# Patient Record
Sex: Male | Born: 1995 | Race: Black or African American | Hispanic: No | Marital: Single | Smoking: Never smoker
Health system: Southern US, Community
[De-identification: ages and names within clinical notes are randomized; demographics above are authoritative.]

## PROBLEM LIST (undated history)

## (undated) ENCOUNTER — Emergency Department (HOSPITAL_COMMUNITY): Discharge status: Absent without leave | Payer: Self-pay

## (undated) DIAGNOSIS — Z8619 Personal history of other infectious and parasitic diseases: Secondary | ICD-10-CM

## (undated) DIAGNOSIS — S63095A Other dislocation of left wrist and hand, initial encounter: Secondary | ICD-10-CM

## (undated) DIAGNOSIS — T148XXA Other injury of unspecified body region, initial encounter: Secondary | ICD-10-CM

---
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
## 2013-12-22 ENCOUNTER — Emergency Department (INDEPENDENT_AMBULATORY_CARE_PROVIDER_SITE_OTHER)
Admission: EM | Admit: 2013-12-22 | Discharge: 2013-12-22 | Disposition: A | Discharge status: Home / Self Care | Payer: Medicaid Other | Source: Home / Self Care | Attending: Family Medicine | Admitting: Family Medicine

## 2013-12-22 ENCOUNTER — Encounter (HOSPITAL_COMMUNITY): Payer: Self-pay | Admitting: Emergency Medicine

## 2013-12-22 ENCOUNTER — Other Ambulatory Visit (HOSPITAL_COMMUNITY)
Admission: RE | Admit: 2013-12-22 | Discharge: 2013-12-22 | Disposition: A | Discharge status: Home / Self Care | Payer: Medicaid Other | Source: Ambulatory Visit | Attending: Family Medicine | Admitting: Family Medicine

## 2013-12-22 DIAGNOSIS — Z113 Encounter for screening for infections with a predominantly sexual mode of transmission: Secondary | ICD-10-CM

## 2013-12-22 DIAGNOSIS — Z202 Contact with and (suspected) exposure to infections with a predominantly sexual mode of transmission: Secondary | ICD-10-CM

## 2013-12-22 MED ORDER — LIDOCAINE HCL (PF) 1 % IJ SOLN
INTRAMUSCULAR | Status: AC
  Filled 2013-12-22: qty 5

## 2013-12-22 MED ORDER — CEFTRIAXONE SODIUM 250 MG IJ SOLR
INTRAMUSCULAR | Status: AC
  Filled 2013-12-22: qty 250

## 2013-12-22 MED ORDER — AZITHROMYCIN 250 MG PO TABS
1000.0000 mg | ORAL_TABLET | Freq: Once | ORAL | Status: AC
  Administered 2013-12-22: 1000 mg via ORAL

## 2013-12-22 MED ORDER — AZITHROMYCIN 250 MG PO TABS
ORAL_TABLET | ORAL | Status: AC
  Filled 2013-12-22: qty 4

## 2013-12-22 MED ORDER — CEFTRIAXONE SODIUM 1 G IJ SOLR
250.0000 mg | Freq: Once | INTRAMUSCULAR | Status: AC
Start: 2013-12-22 — End: 2013-12-22
  Administered 2013-12-22: 250 mg via INTRAMUSCULAR

## 2013-12-22 NOTE — ED Notes (Signed)
Patient knows there is a post injection delay prior to discharge

## 2013-12-22 NOTE — ED Provider Notes (Signed)
CSN: [id number]     Arrival date & time 12/22/13  1534 History   First MD Initiated Contact with Patient 12/22/13 1745     No chief complaint on file.  (Consider location/radiation/quality/duration/timing/severity/associated sxs/prior Treatment) Patient is a 18 y.o. male presenting with STD exposure. The history is provided by the patient.  Exposure to STD This is a new problem. The current episode started yesterday (by by girl yest that she testred pos for gc/chlamydia, . pt last encounter 2 weeks ago without condom.). The problem has not changed since onset.   Past Medical History  Diagnosis Date  . Herpes    No past surgical history on file. No family history on file. History  Substance Use Topics  . Smoking status: Never Smoker   . Smokeless tobacco: Not on file  . Alcohol Use: No    Review of Systems  Gastrointestinal: Negative.   Genitourinary: Negative.     Allergies  Review of patient's allergies indicates no known allergies.  Home Medications   Prior to Admission medications   Medication Sig Start Date End Date Taking? Authorizing Provider  metroNIDAZOLE (FLAGYL) 500 MG tablet Take 1 tablet (500 mg total) by mouth once. 05/19/12   Johnsie Kindredarmen L Chatten, NP   BP 118/72  Pulse 71  Temp(Src) 97.2 F (36.2 C) (Oral)  Resp 14  SpO2 94% Physical Exam  Nursing note and vitals reviewed. Constitutional: He is oriented to person, place, and time. He appears well-developed and well-nourished.  Abdominal: Soft. Bowel sounds are normal.  Genitourinary: Penis normal. No penile tenderness.  Neurological: He is alert and oriented to person, place, and time.  Skin: Skin is warm and dry.    ED Course  Procedures (including critical care time) Labs Review Labs Reviewed  URINE CYTOLOGY ANCILLARY ONLY    Imaging Review No results found.   MDM  No diagnosis found.     Linna HoffJames D Kendarius Vigen, MD 12/22/13 [id number]

## 2013-12-22 NOTE — ED Notes (Signed)
Patient reports he does not have any symptoms.  He was called and told he needed to be checked

## 2013-12-27 ENCOUNTER — Emergency Department (HOSPITAL_COMMUNITY)
Admission: EM | Admit: 2013-12-27 | Discharge: 2013-12-28 | Disposition: A | Discharge status: Home / Self Care | Payer: No Typology Code available for payment source | Attending: Emergency Medicine | Admitting: Emergency Medicine

## 2013-12-27 ENCOUNTER — Emergency Department (HOSPITAL_COMMUNITY): Payer: No Typology Code available for payment source

## 2013-12-27 ENCOUNTER — Telehealth (HOSPITAL_COMMUNITY): Payer: Self-pay | Admitting: *Deleted

## 2013-12-27 ENCOUNTER — Encounter (HOSPITAL_COMMUNITY): Payer: Self-pay | Admitting: Emergency Medicine

## 2013-12-27 DIAGNOSIS — S01112A Laceration without foreign body of left eyelid and periocular area, initial encounter: Secondary | ICD-10-CM

## 2013-12-27 DIAGNOSIS — Y92410 Unspecified street and highway as the place of occurrence of the external cause: Secondary | ICD-10-CM | POA: Diagnosis not present

## 2013-12-27 DIAGNOSIS — S0010XA Contusion of unspecified eyelid and periocular area, initial encounter: Secondary | ICD-10-CM | POA: Diagnosis present

## 2013-12-27 DIAGNOSIS — Z23 Encounter for immunization: Secondary | ICD-10-CM | POA: Diagnosis not present

## 2013-12-27 DIAGNOSIS — S51009A Unspecified open wound of unspecified elbow, initial encounter: Secondary | ICD-10-CM | POA: Insufficient documentation

## 2013-12-27 DIAGNOSIS — S51812A Laceration without foreign body of left forearm, initial encounter: Secondary | ICD-10-CM

## 2013-12-27 DIAGNOSIS — Y9389 Activity, other specified: Secondary | ICD-10-CM | POA: Diagnosis not present

## 2013-12-27 DIAGNOSIS — S058X9A Other injuries of unspecified eye and orbit, initial encounter: Secondary | ICD-10-CM | POA: Insufficient documentation

## 2013-12-27 DIAGNOSIS — Z8619 Personal history of other infectious and parasitic diseases: Secondary | ICD-10-CM | POA: Diagnosis not present

## 2013-12-27 MED ORDER — TETANUS-DIPHTH-ACELL PERTUSSIS 5-2.5-18.5 LF-MCG/0.5 IM SUSP
0.5000 mL | Freq: Once | INTRAMUSCULAR | Status: AC
  Administered 2013-12-27: 0.5 mL via INTRAMUSCULAR
  Filled 2013-12-27: qty 0.5

## 2013-12-27 MED ORDER — HYDROCODONE-ACETAMINOPHEN 5-325 MG PO TABS: 1.0000 | ORAL_TABLET | ORAL | Status: DC | PRN

## 2013-12-27 MED ORDER — NAPROXEN 500 MG PO TABS: 500.0000 mg | ORAL_TABLET | Freq: Two times a day (BID) | ORAL | Status: DC

## 2013-12-27 NOTE — ED Notes (Signed)
GC/Trich neg., Chlamydia pos. Pt. Adequately treated with Zithromax and also got Rocephin.  I called mobile number, but VM not set up. I called home number and it is not a valid number. I called contact and left a message for pt. to call. Call 1. Kurt Sullivan 12/27/2013
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
## 2013-12-27 NOTE — ED Notes (Signed)
In xray

## 2013-12-28 NOTE — ED Notes (Signed)
Pt discharged home with all belongings, pt alert, oriented and ambulatory upon discharge, 2 new rx prescribed, pt verbalizes understanding of discharge instructions, pt driven home by family

## 2013-12-28 NOTE — ED Notes (Signed)
I called and left a message to call with contact.  Call 2.  Pt. Called back.  Pt. verified x 2 and given results.  Pt. Told he was adequately treated with Zithromax.  Pt. instructed to notify his partner, no sex for 1 week and to practice safe sex. [facility] by [facility].  [facility]. Desiree LucySuzanne M [facility] 12/28/2013

## 2014-01-03 NOTE — ED Provider Notes (Signed)
Medical screening examination/treatment/procedure(s) were performed by non-physician practitioner and as supervising physician I was immediately available for consultation/collaboration.   EKG Interpretation None        Florentina Marquart, MD 01/03/14 1534 

## 2015-08-30 ENCOUNTER — Emergency Department (INDEPENDENT_AMBULATORY_CARE_PROVIDER_SITE_OTHER)
Admission: EM | Admit: 2015-08-30 | Discharge: 2015-08-30 | Disposition: A | Discharge status: Home / Self Care | Payer: Medicaid Other | Source: Home / Self Care | Attending: Family Medicine | Admitting: Family Medicine

## 2015-08-30 ENCOUNTER — Other Ambulatory Visit (HOSPITAL_COMMUNITY)
Admission: RE | Admit: 2015-08-30 | Discharge: 2015-08-30 | Disposition: A | Discharge status: Home / Self Care | Payer: Medicaid Other | Source: Ambulatory Visit | Attending: Family Medicine | Admitting: Family Medicine

## 2015-08-30 ENCOUNTER — Encounter (HOSPITAL_COMMUNITY): Payer: Self-pay | Admitting: Emergency Medicine

## 2015-08-30 DIAGNOSIS — Z113 Encounter for screening for infections with a predominantly sexual mode of transmission: Secondary | ICD-10-CM

## 2015-08-30 DIAGNOSIS — Z711 Person with feared health complaint in whom no diagnosis is made: Secondary | ICD-10-CM

## 2015-08-30 NOTE — ED Notes (Signed)
Patient reports having unprotected sex earlier this week and is concerned for std.  Denies any symptoms and does not know of any definite exposure

## 2015-08-30 NOTE — ED Provider Notes (Signed)
CSN: [id number]     Arrival date & time 08/30/15  1301 History   First MD Initiated Contact with Patient 08/30/15 1319     Chief Complaint  Patient presents with  . SEXUALLY TRANSMITTED DISEASE   (Consider location/radiation/quality/duration/timing/severity/associated sxs/prior Treatment) HPI Comments: 20 year old male states he had unprotected sex earlier in the week and now was to be tested for STDs. He states he is unaware of any known exposure. He is asymptomatic.   Past Medical History  Diagnosis Date  . Herpes    History reviewed. No pertinent past surgical history. No family history on file. Social History  Substance Use Topics  . Smoking status: Never Smoker   . Smokeless tobacco: None  . Alcohol Use: No    Review of Systems  Constitutional: Negative.   HENT: Negative.   Genitourinary: Negative.   Skin: Negative.   Neurological: Negative.   All other systems reviewed and are negative.   Allergies  Review of patient's allergies indicates no known allergies.  Home Medications   Prior to Admission medications   Medication Sig Start Date End Date Taking? Authorizing Provider  HYDROcodone-acetaminophen (NORCO) 5-325 MG per tablet Take 1 tablet by mouth every 4 (four) hours as needed. 12/27/13   Arthor Captain, PA-C  naproxen (NAPROSYN) 500 MG tablet Take 1 tablet (500 mg total) by mouth 2 (two) times daily with a meal. 12/27/13   Arthor Captain, PA-C   Meds Ordered and Administered this Visit  Medications - No data to display  BP 114/71 mmHg  Pulse 65  Temp(Src) 97.8 F (36.6 C) (Oral)  Resp 16  SpO2 98% No data found.   Physical Exam  Constitutional: He is oriented to person, place, and time. He appears well-developed and well-nourished. No distress.  Eyes: EOM are normal.  Neck: Normal range of motion. Neck supple.  Cardiovascular: Normal rate.   Pulmonary/Chest: Effort normal. No respiratory distress.  Musculoskeletal: He exhibits no edema.   Neurological: He is alert and oriented to person, place, and time. He exhibits normal muscle tone.  Skin: Skin is dry.  Psychiatric: He has a normal mood and affect.  Nursing note and vitals reviewed.   ED Course  Procedures (including critical care time)  Labs Review Labs Reviewed  URINE CYTOLOGY ANCILLARY ONLY    Imaging Review No results found.   Visual Acuity Review  Right Eye Distance:   Left Eye Distance:   Bilateral Distance:    Right Eye Near:   Left Eye Near:    Bilateral Near:         MDM   1. Concern about STD in male without diagnosis    Std and Safe sex info given Urine cytology for STD pending Will call and tx appropriately after results are back.    Hayden Rasmussen, NP 08/30/15 1348

## 2015-09-02 ENCOUNTER — Telehealth (HOSPITAL_COMMUNITY): Payer: Self-pay | Admitting: Emergency Medicine

## 2015-09-02 LAB — URINE CYTOLOGY ANCILLARY ONLY
Chlamydia: NEGATIVE
Neisseria Gonorrhea: NEGATIVE
TRICH (WINDOWPATH): NEGATIVE

## 2015-09-02 NOTE — ED Notes (Signed)
Called pt and notified her of recent lab results from visit 08/30/15 Pt ID'd properly... Reports she's feeling better Pt is Neg for Gon/Chlam/Trich Pt given education on safe sex and proper hgyiene Adv pt if sx are not getting better to return  Pt verb understanding.

## 2015-09-13 ENCOUNTER — Other Ambulatory Visit (HOSPITAL_COMMUNITY)
Admission: RE | Admit: 2015-09-13 | Discharge: 2015-09-13 | Disposition: A | Discharge status: Home / Self Care | Payer: Self-pay | Source: Ambulatory Visit | Attending: Family Medicine | Admitting: Family Medicine

## 2015-09-13 ENCOUNTER — Emergency Department (INDEPENDENT_AMBULATORY_CARE_PROVIDER_SITE_OTHER)
Admission: EM | Admit: 2015-09-13 | Discharge: 2015-09-13 | Disposition: A | Discharge status: Home / Self Care | Payer: Self-pay | Source: Home / Self Care | Attending: Family Medicine | Admitting: Family Medicine

## 2015-09-13 ENCOUNTER — Encounter (HOSPITAL_COMMUNITY): Payer: Self-pay | Admitting: *Deleted

## 2015-09-13 DIAGNOSIS — A6000 Herpesviral infection of urogenital system, unspecified: Secondary | ICD-10-CM

## 2015-09-13 DIAGNOSIS — Z113 Encounter for screening for infections with a predominantly sexual mode of transmission: Secondary | ICD-10-CM | POA: Insufficient documentation

## 2015-09-13 DIAGNOSIS — N341 Nonspecific urethritis: Secondary | ICD-10-CM

## 2015-09-13 MED ORDER — AZITHROMYCIN 250 MG PO TABS
1000.0000 mg | ORAL_TABLET | Freq: Once | ORAL | Status: AC
  Administered 2015-09-13: 1000 mg via ORAL

## 2015-09-13 MED ORDER — CEFTRIAXONE SODIUM 250 MG IJ SOLR
250.0000 mg | Freq: Once | INTRAMUSCULAR | Status: AC
  Administered 2015-09-13: 250 mg via INTRAMUSCULAR

## 2015-09-13 MED ORDER — AZITHROMYCIN 250 MG PO TABS
ORAL_TABLET | ORAL | Status: AC
  Filled 2015-09-13: qty 4

## 2015-09-13 MED ORDER — CEFTRIAXONE SODIUM 250 MG IJ SOLR
INTRAMUSCULAR | Status: AC
  Filled 2015-09-13: qty 250

## 2015-09-13 NOTE — ED Provider Notes (Signed)
[id number]     Arrival date & time 09/13/15  1300 History   First MD Initiated Contact with Patient 09/13/15 1311     No chief complaint on file.  (Consider location/radiation/quality/duration/timing/severity/associated sxs/prior Treatment) Patient is a 20 y.o. male presenting with STD exposure. The history is provided by the patient.  Exposure to STD This is a recurrent problem. The current episode started more than 2 days ago (seen 1/20 for std check, results neg, having unprotected sex and now with dysuria and herpes outbreak since not taking valtrex.). The problem has been gradually worsening. Pertinent negatives include no chest pain, no abdominal pain, no headaches and no shortness of breath.    Past Medical History  Diagnosis Date  . Herpes    History reviewed. No pertinent past surgical history. History reviewed. No pertinent family history. Social History  Substance Use Topics  . Smoking status: Never Smoker   . Smokeless tobacco: None  . Alcohol Use: No    Review of Systems  Constitutional: Negative.   Respiratory: Negative for shortness of breath.   Cardiovascular: Negative for chest pain.  Gastrointestinal: Negative for abdominal pain.  Genitourinary: Positive for dysuria and genital sores. Negative for discharge, penile swelling, scrotal swelling, penile pain and testicular pain.  Skin: Positive for rash.  Neurological: Negative for headaches.  All other systems reviewed and are negative.   Allergies  Review of patient's allergies indicates no known allergies.  Home Medications   Prior to Admission medications   Medication Sig Start Date End Date Taking? Authorizing Provider  HYDROcodone-acetaminophen (NORCO) 5-325 MG per tablet Take 1 tablet by mouth every 4 (four) hours as needed. 12/27/13   Arthor Captain, PA-C  naproxen (NAPROSYN) 500 MG tablet Take 1 tablet (500 mg total) by mouth 2 (two) times daily with a meal. 12/27/13   Arthor Captain, PA-C   Meds  Ordered and Administered this Visit   Medications  cefTRIAXone (ROCEPHIN) injection 250 mg (not administered)  azithromycin (ZITHROMAX) tablet 1,000 mg (not administered)    BP 111/69 mmHg  Pulse 66  Temp(Src) 97.9 F (36.6 C) (Oral)  Resp 16  SpO2 100% No data found.   Physical Exam  Constitutional: He is oriented to person, place, and time. He appears well-developed and well-nourished.  Abdominal: Soft. Bowel sounds are normal.  Genitourinary: Testes normal.    Circumcised. Penile tenderness present. No discharge found.  Lymphadenopathy:       Right: No inguinal adenopathy present.       Left: No inguinal adenopathy present.  Neurological: He is alert and oriented to person, place, and time.  Skin: Skin is warm and dry.  Nursing note and vitals reviewed.   ED Course  Procedures (including critical care time)  Labs Review Labs Reviewed  CYTOLOGY, (ORAL, ANAL, URETHRAL) ANCILLARY ONLY    Imaging Review No results found.   Visual Acuity Review  Right Eye Distance:   Left Eye Distance:   Bilateral Distance:    Right Eye Near:   Left Eye Near:    Bilateral Near:         MDM   1. Urethritis, nonspecific   2. Herpes genitalis    Meds ordered this encounter  Medications  . cefTRIAXone (ROCEPHIN) injection 250 mg    Sig:   . azithromycin (ZITHROMAX) tablet 1,000 mg    Sig:        Linna Hoff, MD 09/13/15 1343

## 2015-09-13 NOTE — ED Notes (Signed)
Pt  Reports  Symptoms  Of  Burning  On  Urination           Seen  ucc  sev  Weeks  Ago    Continues  To  Have  Symptoms

## 2015-09-13 NOTE — Discharge Instructions (Signed)
We will call with positive test results and treat as indicated  °

## 2015-09-17 LAB — CYTOLOGY, (ORAL, ANAL, URETHRAL) ANCILLARY ONLY
Chlamydia: NEGATIVE
Neisseria Gonorrhea: NEGATIVE

## 2015-12-07 ENCOUNTER — Encounter (HOSPITAL_COMMUNITY): Payer: Self-pay | Admitting: Emergency Medicine

## 2015-12-07 ENCOUNTER — Emergency Department (HOSPITAL_COMMUNITY)
Admission: EM | Admit: 2015-12-07 | Discharge: 2015-12-07 | Disposition: A | Discharge status: Home / Self Care | Payer: Self-pay | Attending: Emergency Medicine | Admitting: Emergency Medicine

## 2015-12-07 DIAGNOSIS — Z79891 Long term (current) use of opiate analgesic: Secondary | ICD-10-CM | POA: Insufficient documentation

## 2015-12-07 DIAGNOSIS — N342 Other urethritis: Secondary | ICD-10-CM | POA: Insufficient documentation

## 2015-12-07 DIAGNOSIS — Z791 Long term (current) use of non-steroidal anti-inflammatories (NSAID): Secondary | ICD-10-CM | POA: Insufficient documentation

## 2015-12-07 LAB — URINE MICROSCOPIC-ADD ON
RBC / HPF: NONE SEEN RBC/hpf (ref 0–5)
Squamous Epithelial / LPF: NONE SEEN

## 2015-12-07 LAB — URINALYSIS, ROUTINE W REFLEX MICROSCOPIC
Bilirubin Urine: NEGATIVE
GLUCOSE, UA: NEGATIVE mg/dL
HGB URINE DIPSTICK: NEGATIVE
Ketones, ur: NEGATIVE mg/dL
Nitrite: NEGATIVE
PH: 6 (ref 5.0–8.0)
Protein, ur: NEGATIVE mg/dL
SPECIFIC GRAVITY, URINE: 1.027 (ref 1.005–1.030)

## 2015-12-07 LAB — RAPID HIV SCREEN (HIV 1/2 AB+AG)
HIV 1/2 Antibodies: NONREACTIVE
HIV-1 P24 Antigen - HIV24: NONREACTIVE

## 2015-12-07 MED ORDER — CEFTRIAXONE SODIUM 250 MG IJ SOLR
250.0000 mg | Freq: Once | INTRAMUSCULAR | Status: AC
  Administered 2015-12-07: 250 mg via INTRAMUSCULAR
  Filled 2015-12-07: qty 250

## 2015-12-07 MED ORDER — STERILE WATER FOR INJECTION IJ SOLN
INTRAMUSCULAR | Status: AC
  Administered 2015-12-07: 10 mL
  Filled 2015-12-07: qty 10

## 2015-12-07 MED ORDER — AZITHROMYCIN 250 MG PO TABS
1000.0000 mg | ORAL_TABLET | Freq: Once | ORAL | Status: AC
  Administered 2015-12-07: 1000 mg via ORAL
  Filled 2015-12-07: qty 4

## 2015-12-07 NOTE — ED Provider Notes (Signed)
CSN: [id number]     Arrival date & time 12/07/15  0146 History   First MD Initiated Contact with Patient 12/07/15 [phone]     Chief Complaint  Patient presents with  . Penile Discharge     (Consider location/radiation/quality/duration/timing/severity/associated sxs/prior Treatment) Patient is a 20 y.o. male presenting with penile discharge. The history is provided by the patient. No language interpreter was used.  Penile Discharge This is a new problem. The current episode started yesterday. Pertinent negatives include no abdominal pain, chills, fever, myalgias, rash or vomiting. Associated symptoms comments: Penile discharge without fever, abdominal pain, vomiting. He reports dysuria. .    Past Medical History  Diagnosis Date  . Herpes    History reviewed. No pertinent past surgical history. History reviewed. No pertinent family history. Social History  Substance Use Topics  . Smoking status: Never Smoker   . Smokeless tobacco: None  . Alcohol Use: No    Review of Systems  Constitutional: Negative for fever and chills.  Gastrointestinal: Negative.  Negative for vomiting and abdominal pain.  Genitourinary: Positive for dysuria and discharge.  Musculoskeletal: Negative.  Negative for myalgias.  Skin: Negative.  Negative for rash.  Neurological: Negative.       Allergies  Review of patient's allergies indicates no known allergies.  Home Medications   Prior to Admission medications   Medication Sig Start Date End Date Taking? Authorizing Provider  HYDROcodone-acetaminophen (NORCO) 5-325 MG per tablet Take 1 tablet by mouth every 4 (four) hours as needed. 12/27/13   Arthor CaptainAbigail Harris, PA-C  naproxen (NAPROSYN) 500 MG tablet Take 1 tablet (500 mg total) by mouth 2 (two) times daily with a meal. 12/27/13   Abigail Harris, PA-C   BP 106/90 mmHg  Pulse 68  Temp(Src) 98.2 F (36.8 C) (Oral)  Resp 18  Ht 5\' 7"  (1.702 m)  Wt 90.719 kg  BMI 31.32 kg/m2  SpO2 100% Physical Exam   Constitutional: He is oriented to person, place, and time. He appears well-developed and well-nourished.  Neck: Normal range of motion.  Pulmonary/Chest: Effort normal.  Abdominal: Soft. There is no tenderness.  Genitourinary:  Circumcised. No penile discharge visualized. No testicular tenderness or scrotal swelling.   Musculoskeletal: Normal range of motion.  Neurological: He is alert and oriented to person, place, and time.  Skin: Skin is warm and dry.  Psychiatric: He has a normal mood and affect.    ED Course  Procedures (including critical care time) Labs Review Labs Reviewed  URINALYSIS, ROUTINE W REFLEX MICROSCOPIC ([facility]) - Abnormal; Notable for the following:    APPearance CLOUDY (*)    Leukocytes, UA MODERATE (*)    All other components within normal limits  URINE MICROSCOPIC-ADD ON - Abnormal; Notable for the following:    Bacteria, UA FEW (*)    All other components within normal limits  RPR  RAPID HIV SCREEN (HIV 1/2 AB+AG)  GC/CHLAMYDIA PROBE AMP ([place]) NOT AT [facility]    Imaging Review No results found. I have personally reviewed and evaluated these images and lab results as part of my medical decision-making.   EKG Interpretation None      MDM   Final diagnoses:  None    1. Urethritis  Patient treated in the ED with Rocephin and Zithromax for uncomplicated urethritis.     Elpidio AnisShari Joselyn Edling, PA-C 12/07/15 [phone]  Zadie Rhineonald Wickline, MD 12/07/15 [phone]

## 2015-12-07 NOTE — ED Notes (Signed)
Pt presents to ED with complaints of penile discharge and burning on urination.  States his girlfriend is being seen here for it too.

## 2015-12-07 NOTE — Discharge Instructions (Signed)
Urethritis, Adult °Urethritis is an inflammation of the tube through which urine exits your bladder (urethra).  °CAUSES °Urethritis is often caused by an infection in your urethra. The infection can be viral, like herpes. The infection can also be bacterial, like gonorrhea. °RISK FACTORS °Risk factors of urethritis include: °· Having sex without using a condom. °· Having multiple sexual partners. °· Having poor hygiene. °SIGNS AND SYMPTOMS °Symptoms of urethritis are less noticeable in women than in men. These symptoms include: °· Burning feeling when you urinate (dysuria). °· Discharge from your urethra. °· Blood in your urine (hematuria). °· Urinating more than usual. °DIAGNOSIS  °To confirm a diagnosis of urethritis, your health care provider will do the following: °· Ask about your sexual history. °· Perform a physical exam. °· Have you provide a sample of your urine for lab testing. °· Use a cotton swab to gently collect a sample from your urethra for lab testing. °TREATMENT  °It is important to treat urethritis. Depending on the cause, untreated urethritis may lead to serious genital infections and possibly infertility. Urethritis caused by a bacterial infection is treated with antibiotic medicine. All sexual partners must be treated.  °HOME CARE INSTRUCTIONS °· Do not have sex until the test results are known and treatment is completed, even if your symptoms go away before you finish treatment. °· If you were prescribed an antibiotic, finish it all even if you start to feel better. °SEEK MEDICAL CARE IF:  °· Your symptoms are not improved in 3 days. °· Your symptoms are getting worse. °· You develop abdominal pain or pelvic pain (in women). °· You develop joint pain. °· You have a fever. °SEEK IMMEDIATE MEDICAL CARE IF:  °· You have severe pain in the belly, back, or side. °· You have repeated vomiting. °MAKE SURE YOU: °· Understand these instructions. °· Will watch your condition. °· Will get help right away  if you are not doing well or get worse. °  °This information is not intended to replace advice given to you by your health care provider. Make sure you discuss any questions you have with your health care provider. °  °Document Released: 01/20/2001 Document Revised: 12/11/2014 Document Reviewed: 03/27/2013 °Elsevier Interactive Patient Education ©2016 [employer] °Safe Sex °Safe sex is about reducing the risk of giving or getting a sexually transmitted disease (STD). STDs are spread through sexual contact involving the genitals, mouth, or rectum. Some STDs can be cured and others cannot. Safe sex can also prevent unintended pregnancies.  °WHAT ARE SOME SAFE SEX PRACTICES? °· Limit your sexual activity to only one partner who is having sex with only you. °· Talk to your partner about his or her past partners, past STDs, and drug use. °· Use a condom every time you have sexual intercourse. This includes vaginal, oral, and anal sexual activity. Both females and males should wear condoms during oral sex. Only use latex or polyurethane condoms and water-based lubricants. Using petroleum-based lubricants or oils to lubricate a condom will weaken the condom and increase the chance that it will break. The condom should be in place from the beginning to the end of sexual activity. Wearing a condom reduces, but does not completely eliminate, your risk of getting or giving an STD. STDs can be spread by contact with infected body fluids and skin. °· Get vaccinated for hepatitis B and HPV. °· Avoid alcohol and recreational drugs, which can affect your judgment. You may forget to use a condom or participate   in high-risk sex. °· For females, avoid douching after sexual intercourse. Douching can spread an infection farther into the reproductive tract. °· Check your body for signs of sores, blisters, rashes, or unusual discharge. See your health care provider if you notice any of these signs. °· Avoid sexual contact if you have  symptoms of an infection or are being treated for an STD. If you or your partner has herpes, avoid sexual contact when blisters are present. Use condoms at all other times. °· If you are at risk of being infected with HIV, it is recommended that you take a prescription medicine daily to prevent HIV infection. This is called pre-exposure prophylaxis (PrEP). You are considered at risk if: °¨ You are a man who has sex with other men (MSM). °¨ You are a heterosexual man or woman who is sexually active with more than one partner. °¨ You take drugs by injection. °¨ You are sexually active with a partner who has HIV. °· Talk with your health care provider about whether you are at high risk of being infected with HIV. If you choose to begin PrEP, you should first be tested for HIV. You should then be tested every 3 months for as long as you are taking PrEP. °· See your health care provider for regular screenings, exams, and tests for other STDs. Before having sex with a new partner, each of you should be screened for STDs and should talk about the results with each other. °WHAT ARE THE BENEFITS OF SAFE SEX?  °· There is less chance of getting or giving an STD. °· You can prevent unwanted or unintended pregnancies. °· By discussing safe sex concerns with your partner, you may increase feelings of intimacy, comfort, trust, and honesty between the two of you. °  °This information is not intended to replace advice given to you by your health care provider. Make sure you discuss any questions you have with your health care provider. °  °Document Released: 09/03/2004 Document Revised: 08/17/2014 Document Reviewed: 01/18/2012 °Elsevier Interactive Patient Education ©2016 Elsevier Inc. ° °

## 2015-12-08 LAB — RPR: RPR: NONREACTIVE

## 2015-12-09 LAB — GC/CHLAMYDIA PROBE AMP (~~LOC~~) NOT AT ARMC
CHLAMYDIA, DNA PROBE: NEGATIVE
Neisseria Gonorrhea: NEGATIVE

## 2017-07-15 ENCOUNTER — Encounter (HOSPITAL_COMMUNITY): Payer: Self-pay | Admitting: Emergency Medicine

## 2017-07-15 ENCOUNTER — Ambulatory Visit (HOSPITAL_COMMUNITY)
Admission: EM | Admit: 2017-07-15 | Discharge: 2017-07-15 | Disposition: A | Discharge status: Home / Self Care | Payer: Self-pay | Attending: Family Medicine | Admitting: Family Medicine

## 2017-07-15 DIAGNOSIS — R369 Urethral discharge, unspecified: Secondary | ICD-10-CM

## 2017-07-15 DIAGNOSIS — Z113 Encounter for screening for infections with a predominantly sexual mode of transmission: Secondary | ICD-10-CM

## 2017-07-15 DIAGNOSIS — R350 Frequency of micturition: Secondary | ICD-10-CM

## 2017-07-15 DIAGNOSIS — R300 Dysuria: Secondary | ICD-10-CM

## 2017-07-15 LAB — POCT URINALYSIS DIP (DEVICE)
Bilirubin Urine: NEGATIVE
Glucose, UA: 100 mg/dL — AB
KETONES UR: NEGATIVE mg/dL
Nitrite: POSITIVE — AB
PH: 7 (ref 5.0–8.0)
PROTEIN: NEGATIVE mg/dL
SPECIFIC GRAVITY, URINE: 1.015 (ref 1.005–1.030)
Urobilinogen, UA: 1 mg/dL (ref 0.0–1.0)

## 2017-07-15 MED ORDER — CEFTRIAXONE SODIUM 250 MG IJ SOLR
INTRAMUSCULAR | Status: AC
  Filled 2017-07-15: qty 250

## 2017-07-15 MED ORDER — AZITHROMYCIN 250 MG PO TABS
ORAL_TABLET | ORAL | Status: AC
  Filled 2017-07-15: qty 4

## 2017-07-15 MED ORDER — CEFTRIAXONE SODIUM 250 MG IJ SOLR
250.0000 mg | Freq: Once | INTRAMUSCULAR | Status: AC
  Administered 2017-07-15: 250 mg via INTRAMUSCULAR

## 2017-07-15 MED ORDER — AZITHROMYCIN 250 MG PO TABS
1000.0000 mg | ORAL_TABLET | Freq: Once | ORAL | Status: AC
  Administered 2017-07-15: 1000 mg via ORAL

## 2017-07-15 NOTE — Discharge Instructions (Signed)
We have treated you for Gonorrhea and Chlamydia today. No further treatment is necessary, unless your symptoms are not improving. We are sending your urine for testing and will call you regarding anything abnormal.   Please practice safe sex with condoms. Please inform any partners so that they may get treated as well.   Please return if you start to experience pain, abdominal pain, fever, nausea, vomiting, failure to improve in 1 week.

## 2017-07-15 NOTE — ED Provider Notes (Signed)
[facility]    CSN: [id number] Arrival date & time: 07/15/17  1001     History   Chief Complaint Chief Complaint  Patient presents with  . Penile Discharge    HPI Kurt Sullivan is a 21 y.o. male with history of genital herpes and previous STDs presenting with symptoms of burning with urination and penile discharge. He noticed the dysuria a little over a week ago and the discharge a few days later. Feels like he has to urinate every 2-3 hours. Discharge is whitish and clear. Endorses unprotected sex before leaving on his last trip. Takes acyclovir for herpes. Denies lesions on penis, scrotum, genital area, denies oral lesions. Endorses fever and URI symptoms for a couple days last week but those symptoms have resolved. No nausea, vomiting, scrotal pain. No abdominal or back pain.   HPI  Past Medical History:  Diagnosis Date  . Herpes     There are no active problems to display for this patient.   History reviewed. No pertinent surgical history.     Home Medications    Prior to Admission medications   Medication Sig Start Date End Date Taking? Authorizing Provider  HYDROcodone-acetaminophen (NORCO) 5-325 MG per tablet Take 1 tablet by mouth every 4 (four) hours as needed. 12/27/13   Arthor CaptainHarris, Abigail, PA-C  naproxen (NAPROSYN) 500 MG tablet Take 1 tablet (500 mg total) by mouth 2 (two) times daily with a meal. 12/27/13   Arthor CaptainHarris, Abigail, PA-C    Family History History reviewed. No pertinent family history.  Social History Social History   Tobacco Use  . Smoking status: Never Smoker  Substance Use Topics  . Alcohol use: No  . Drug use: No     Allergies   Patient has no known allergies.   Review of Systems Review of Systems  Constitutional: Positive for fever. Negative for chills and fatigue.  HENT: Positive for congestion. Negative for sore throat.   Respiratory: Negative for cough and shortness of breath.   Cardiovascular: Negative for chest pain.   Gastrointestinal: Negative for abdominal pain, diarrhea, nausea and vomiting.  Genitourinary: Positive for discharge, dysuria and frequency. Negative for flank pain, genital sores, hematuria, penile pain, penile swelling, scrotal swelling and testicular pain.  Musculoskeletal: Negative for back pain.  Skin: Negative for rash.  Neurological: Negative for dizziness, light-headedness and headaches.     Physical Exam Triage Vital Signs ED Triage Vitals [07/15/17 1013]  Enc Vitals Group     BP 138/81     Pulse Rate 67     Resp 18     Temp 98.1 F (36.7 C)     Temp Source Oral     SpO2 98 %     Weight      Height      Head Circumference      Peak Flow      Pain Score      Pain Loc      Pain Edu?      Excl. in GC?    No data found.  Updated Vital Signs BP 138/81 (BP Location: Right Arm)   Pulse 67   Temp 98.1 F (36.7 C) (Oral)   Resp 18   SpO2 98%      Physical Exam  Constitutional: He appears well-developed and well-nourished.  HENT:  Head: Normocephalic and atraumatic.  Mouth/Throat: Oropharynx is clear and moist. No oropharyngeal exudate.  Eyes: Conjunctivae are normal.  Neck: Normal range of motion. Neck supple.  Cardiovascular: Normal  rate and regular rhythm.  No murmur heard. Pulmonary/Chest: Effort normal and breath sounds normal. No respiratory distress.  Abdominal: Soft. He exhibits no distension. There is no tenderness. There is no guarding.  Genitourinary: Penis normal. No penile tenderness.  Genitourinary Comments: No lesions on penis, scrotum or groin. No discharge seen in urethral meatus, no discharge expressed.   Musculoskeletal: He exhibits no edema.  Neurological: He is alert.  Skin: Skin is warm and dry. No rash noted.  Psychiatric: He has a normal mood and affect.  Nursing note and vitals reviewed.    UC Treatments / Results  Labs (all labs ordered are listed, but only abnormal results are displayed) Labs Reviewed  POCT URINALYSIS DIP  (DEVICE) - Abnormal; Notable for the following components:      Result Value   Glucose, UA 100 (*)    Hgb urine dipstick TRACE (*)    Nitrite POSITIVE (*)    Leukocytes, UA SMALL (*)    All other components within normal limits  URINE CULTURE  URINE CYTOLOGY ANCILLARY ONLY    EKG  EKG Interpretation None       Radiology No results found.  Procedures Procedures (including critical care time)  Medications Ordered in UC Medications  cefTRIAXone (ROCEPHIN) injection 250 mg (not administered)  azithromycin (ZITHROMAX) tablet 1,000 mg (not administered)     Initial Impression / Assessment and Plan / UC Course  I have reviewed the triage vital signs and the nursing notes.  Pertinent labs & imaging results that were available during my care of the patient were reviewed by me and considered in my medical decision making (see chart for details).     UA positive for leukocytes and nitrites likely related to an STD, given symptoms and history. Urine culture and cytology sent. Patient was treated with Rocephin 250 mg IM and Azithromycin 1 g. No evidence of epididymitis. Discussed return precautions to include failure of symptoms to resolve with treatment, development of fever, abdominal pain, nausea, vomiting, back pain . Patient verbalized understanding and is agreeable with plan.   Of note, UA also had glucose. Advised to have urine rechecked in 2 weeks.   Final Clinical Impressions(s) / UC Diagnoses   Final diagnoses:  Penile discharge    ED Discharge Orders    None       Controlled Substance Prescriptions [place] Controlled Substance Registry consulted? Not Applicable   Lew DawesWieters, Jene Huq C, [place]PA-C 07/15/17 1051

## 2017-07-15 NOTE — ED Notes (Signed)
Urine specimen obtained. Specimen in lab

## 2017-07-15 NOTE — ED Triage Notes (Signed)
Pt sts some penile discharge and burning with urination

## 2017-07-16 LAB — URINE CULTURE: Culture: NO GROWTH

## 2017-07-16 LAB — URINE CYTOLOGY ANCILLARY ONLY
CHLAMYDIA, DNA PROBE: POSITIVE — AB
NEISSERIA GONORRHEA: NEGATIVE
Trichomonas: NEGATIVE

## 2017-12-05 ENCOUNTER — Emergency Department (HOSPITAL_COMMUNITY)
Admission: EM | Admit: 2017-12-05 | Discharge: 2017-12-06 | Disposition: A | Discharge status: Home / Self Care | Payer: No Typology Code available for payment source | Attending: Emergency Medicine | Admitting: Emergency Medicine

## 2017-12-05 ENCOUNTER — Emergency Department (HOSPITAL_COMMUNITY): Payer: No Typology Code available for payment source

## 2017-12-05 ENCOUNTER — Encounter (HOSPITAL_COMMUNITY): Payer: Self-pay | Admitting: Emergency Medicine

## 2017-12-05 ENCOUNTER — Other Ambulatory Visit: Payer: Self-pay

## 2017-12-05 DIAGNOSIS — Y92410 Unspecified street and highway as the place of occurrence of the external cause: Secondary | ICD-10-CM | POA: Diagnosis not present

## 2017-12-05 DIAGNOSIS — S80211A Abrasion, right knee, initial encounter: Secondary | ICD-10-CM | POA: Diagnosis not present

## 2017-12-05 DIAGNOSIS — S63005A Unspecified dislocation of left wrist and hand, initial encounter: Secondary | ICD-10-CM | POA: Insufficient documentation

## 2017-12-05 DIAGNOSIS — Y999 Unspecified external cause status: Secondary | ICD-10-CM | POA: Diagnosis not present

## 2017-12-05 DIAGNOSIS — S63095A Other dislocation of left wrist and hand, initial encounter: Secondary | ICD-10-CM

## 2017-12-05 DIAGNOSIS — S70311A Abrasion, right thigh, initial encounter: Secondary | ICD-10-CM | POA: Insufficient documentation

## 2017-12-05 DIAGNOSIS — Y939 Activity, unspecified: Secondary | ICD-10-CM | POA: Diagnosis not present

## 2017-12-05 DIAGNOSIS — T148XXA Other injury of unspecified body region, initial encounter: Secondary | ICD-10-CM

## 2017-12-05 HISTORY — DX: Other dislocation of left wrist and hand, initial encounter: S63.095A

## 2017-12-05 MED ORDER — PROPOFOL 10 MG/ML IV BOLUS
75.0000 mg | Freq: Once | INTRAVENOUS | Status: AC
  Administered 2017-12-05: 75 mg via INTRAVENOUS
  Filled 2017-12-05: qty 20

## 2017-12-05 MED ORDER — FENTANYL CITRATE (PF) 100 MCG/2ML IJ SOLN
100.0000 ug | Freq: Once | INTRAMUSCULAR | Status: AC
Start: 2017-12-05 — End: 2017-12-05
  Administered 2017-12-05: 100 ug via INTRAVENOUS
  Filled 2017-12-05: qty 2

## 2017-12-05 MED ORDER — PROPOFOL 10 MG/ML IV BOLUS
INTRAVENOUS | Status: AC | PRN
  Administered 2017-12-05: 50 mg via INTRAVENOUS
  Administered 2017-12-05: 45 mg via INTRAVENOUS
  Administered 2017-12-05: 50 mg via INTRAVENOUS

## 2017-12-05 NOTE — Progress Notes (Signed)
Orthopedic Tech Progress Note Patient Details:  Colvin Blatt Mar 24, 1996 [id number]  Ortho Devices Type of Ortho Device: Ace wrap, Volar splint Ortho Device/Splint Location: LUE Ortho Device/Splint Interventions: Ordered, Application   Post Interventions Patient Tolerated: Well Instructions Provided: Care of device   Jennye Moccasin 12/05/2017, 10:52 PM

## 2017-12-05 NOTE — Sedation Documentation (Signed)
BP on monitor during procedure not taking, EDP aware during procedure. BPs taken on dinamap at this time.

## 2017-12-05 NOTE — ED Notes (Signed)
Consents signed for conscious sedation for closed reduction of left hand.

## 2017-12-05 NOTE — Sedation Documentation (Signed)
Pt left arm splinted at this time.
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
## 2017-12-05 NOTE — ED Notes (Signed)
Portable xray at bedside.

## 2017-12-06 MED ORDER — HYDROCODONE-ACETAMINOPHEN 5-325 MG PO TABS: 1.0000 | ORAL_TABLET | Freq: Four times a day (QID) | ORAL | 0 refills | Status: DC | PRN
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
## 2017-12-07 ENCOUNTER — Other Ambulatory Visit: Payer: Self-pay | Admitting: Orthopedic Surgery

## 2017-12-07 ENCOUNTER — Encounter (HOSPITAL_BASED_OUTPATIENT_CLINIC_OR_DEPARTMENT_OTHER): Payer: Self-pay | Admitting: *Deleted

## 2017-12-07 ENCOUNTER — Other Ambulatory Visit: Payer: Self-pay

## 2017-12-07 NOTE — Progress Notes (Signed)
SPOKE W/ PT VIA PHONE FOR PRE-OP INTERVIEW.  NPO AFTER MN.  ARRIVE AT 0530.  PT JUST ADDED ON TODAY.  HE IS AWARE TO HAVE RESPONSIBLE ADULT AT DISCHARGE / PICK-UP.

## 2017-12-10 ENCOUNTER — Ambulatory Visit (HOSPITAL_BASED_OUTPATIENT_CLINIC_OR_DEPARTMENT_OTHER)
Admission: RE | Admit: 2017-12-10 | Discharge: 2017-12-10 | Disposition: A | Discharge status: Home / Self Care | Payer: No Typology Code available for payment source | Source: Ambulatory Visit | Attending: Orthopedic Surgery | Admitting: Orthopedic Surgery

## 2017-12-10 ENCOUNTER — Encounter (HOSPITAL_BASED_OUTPATIENT_CLINIC_OR_DEPARTMENT_OTHER): Payer: Self-pay

## 2017-12-10 ENCOUNTER — Ambulatory Visit (HOSPITAL_BASED_OUTPATIENT_CLINIC_OR_DEPARTMENT_OTHER): Payer: No Typology Code available for payment source | Admitting: Anesthesiology

## 2017-12-10 ENCOUNTER — Encounter (HOSPITAL_BASED_OUTPATIENT_CLINIC_OR_DEPARTMENT_OTHER)
Admission: RE | Disposition: A | Discharge status: Home / Self Care | Payer: Self-pay | Source: Ambulatory Visit | Attending: Orthopedic Surgery

## 2017-12-10 DIAGNOSIS — G5602 Carpal tunnel syndrome, left upper limb: Secondary | ICD-10-CM | POA: Diagnosis not present

## 2017-12-10 DIAGNOSIS — Z79899 Other long term (current) drug therapy: Secondary | ICD-10-CM | POA: Insufficient documentation

## 2017-12-10 DIAGNOSIS — S63095A Other dislocation of left wrist and hand, initial encounter: Secondary | ICD-10-CM | POA: Insufficient documentation

## 2017-12-10 DIAGNOSIS — Y92410 Unspecified street and highway as the place of occurrence of the external cause: Secondary | ICD-10-CM | POA: Diagnosis not present

## 2017-12-10 HISTORY — DX: Personal history of other infectious and parasitic diseases: Z86.19

## 2017-12-10 HISTORY — DX: Other injury of unspecified body region, initial encounter: T14.8XXA

## 2017-12-10 HISTORY — DX: Other dislocation of left wrist and hand, initial encounter: S63.095A

## 2017-12-10 HISTORY — PX: ORIF WRIST FRACTURE: SHX2133

## 2017-12-10 SURGERY — OPEN REDUCTION INTERNAL FIXATION (ORIF) WRIST FRACTURE
Anesthesia: General | Site: Wrist | Laterality: Left

## 2017-12-10 MED ORDER — DEXAMETHASONE SODIUM PHOSPHATE 10 MG/ML IJ SOLN
INTRAMUSCULAR | Status: AC
  Filled 2017-12-10: qty 1

## 2017-12-10 MED ORDER — CHLORHEXIDINE GLUCONATE 4 % EX LIQD
60.0000 mL | Freq: Once | CUTANEOUS | Status: DC
  Filled 2017-12-10: qty 118

## 2017-12-10 MED ORDER — CEFAZOLIN SODIUM-DEXTROSE 2-4 GM/100ML-% IV SOLN
2.0000 g | INTRAVENOUS | Status: AC
  Administered 2017-12-10: 2 g via INTRAVENOUS
  Filled 2017-12-10: qty 100

## 2017-12-10 MED ORDER — LIDOCAINE 2% (20 MG/ML) 5 ML SYRINGE
INTRAMUSCULAR | Status: DC | PRN
  Administered 2017-12-10: 80 mg via INTRAVENOUS

## 2017-12-10 MED ORDER — BUPIVACAINE HCL (PF) 0.25 % IJ SOLN
INTRAMUSCULAR | Status: DC | PRN
  Administered 2017-12-10 (×2): 10 mL

## 2017-12-10 MED ORDER — PROPOFOL 10 MG/ML IV BOLUS
INTRAVENOUS | Status: DC | PRN
  Administered 2017-12-10: 50 mg via INTRAVENOUS
  Administered 2017-12-10: 200 mg via INTRAVENOUS
  Administered 2017-12-10: 50 mg via INTRAVENOUS

## 2017-12-10 MED ORDER — HYDROMORPHONE HCL 1 MG/ML IJ SOLN
0.2500 mg | INTRAMUSCULAR | Status: DC | PRN
  Administered 2017-12-10 (×2): 0.25 mg via INTRAVENOUS
  Administered 2017-12-10: 0.5 mg via INTRAVENOUS
  Filled 2017-12-10: qty 0.5

## 2017-12-10 MED ORDER — DEXAMETHASONE SODIUM PHOSPHATE 10 MG/ML IJ SOLN
INTRAMUSCULAR | Status: DC | PRN
  Administered 2017-12-10: 10 mg via INTRAVENOUS

## 2017-12-10 MED ORDER — ONDANSETRON HCL 4 MG/2ML IJ SOLN
INTRAMUSCULAR | Status: AC
  Filled 2017-12-10: qty 2

## 2017-12-10 MED ORDER — ONDANSETRON HCL 4 MG/2ML IJ SOLN
INTRAMUSCULAR | Status: DC | PRN
  Administered 2017-12-10: 4 mg via INTRAVENOUS

## 2017-12-10 MED ORDER — MIDAZOLAM HCL 2 MG/2ML IJ SOLN
INTRAMUSCULAR | Status: DC | PRN
  Administered 2017-12-10: 2 mg via INTRAVENOUS

## 2017-12-10 MED ORDER — CEFAZOLIN SODIUM-DEXTROSE 2-4 GM/100ML-% IV SOLN
INTRAVENOUS | Status: AC
  Filled 2017-12-10: qty 100

## 2017-12-10 MED ORDER — FENTANYL CITRATE (PF) 100 MCG/2ML IJ SOLN
INTRAMUSCULAR | Status: DC | PRN
  Administered 2017-12-10: 50 ug via INTRAVENOUS
  Administered 2017-12-10 (×2): 25 ug via INTRAVENOUS

## 2017-12-10 MED ORDER — OXYCODONE-ACETAMINOPHEN 5-325 MG PO TABS
ORAL_TABLET | ORAL | Status: AC
  Filled 2017-12-10: qty 1

## 2017-12-10 MED ORDER — OXYCODONE-ACETAMINOPHEN 5-325 MG PO TABS
1.0000 | ORAL_TABLET | Freq: Four times a day (QID) | ORAL | Status: DC | PRN
  Administered 2017-12-10: 1 via ORAL
  Filled 2017-12-10: qty 1

## 2017-12-10 MED ORDER — MIDAZOLAM HCL 2 MG/2ML IJ SOLN
INTRAMUSCULAR | Status: AC
  Filled 2017-12-10: qty 2

## 2017-12-10 MED ORDER — LIDOCAINE 2% (20 MG/ML) 5 ML SYRINGE
INTRAMUSCULAR | Status: AC
  Filled 2017-12-10: qty 5

## 2017-12-10 MED ORDER — FENTANYL CITRATE (PF) 100 MCG/2ML IJ SOLN
INTRAMUSCULAR | Status: AC
  Filled 2017-12-10: qty 2

## 2017-12-10 MED ORDER — LACTATED RINGERS IV SOLN
INTRAVENOUS | Status: DC
  Administered 2017-12-10: 1000 mL via INTRAVENOUS
  Administered 2017-12-10 (×2): via INTRAVENOUS
  Filled 2017-12-10: qty 1000

## 2017-12-10 MED ORDER — PROPOFOL 10 MG/ML IV BOLUS
INTRAVENOUS | Status: AC
  Filled 2017-12-10: qty 40

## 2017-12-10 MED ORDER — HYDROMORPHONE HCL 1 MG/ML IJ SOLN
INTRAMUSCULAR | Status: AC
  Filled 2017-12-10: qty 1

## 2017-12-10 SURGICAL SUPPLY — 45 items
BANDAGE ACE 3X5.8 VEL STRL LF (GAUZE/BANDAGES/DRESSINGS) ×6 IMPLANT
BANDAGE ELASTIC 3 VELCRO ST LF (GAUZE/BANDAGES/DRESSINGS) ×6 IMPLANT
BLADE SURG 15 STRL LF DISP TIS (BLADE) ×2 IMPLANT
BLADE SURG 15 STRL SS (BLADE) ×6
BNDG CMPR 9X4 STRL LF SNTH (GAUZE/BANDAGES/DRESSINGS) ×1
BNDG ESMARK 4X9 LF (GAUZE/BANDAGES/DRESSINGS) ×3 IMPLANT
BNDG GAUZE ELAST 4 BULKY (GAUZE/BANDAGES/DRESSINGS) ×3 IMPLANT
CORD BIPOLAR FORCEPS 12FT (ELECTRODE) ×3 IMPLANT
COVER BACK TABLE 60X90IN (DRAPES) ×3 IMPLANT
CUFF TOURNIQUET SINGLE 18IN (TOURNIQUET CUFF) ×3 IMPLANT
DRAPE EXTREMITY T 121X128X90 (DRAPE) ×3 IMPLANT
DRAPE LG THREE QUARTER DISP (DRAPES) ×3 IMPLANT
DRAPE OEC MINIVIEW 54X84 (DRAPES) ×3 IMPLANT
DRAPE SURG 17X23 STRL (DRAPES) ×3 IMPLANT
DURAPREP 26ML APPLICATOR (WOUND CARE) ×6 IMPLANT
GAUZE SPONGE 4X4 12PLY STRL (GAUZE/BANDAGES/DRESSINGS) ×3 IMPLANT
GAUZE SPONGE 4X4 12PLY STRL LF (GAUZE/BANDAGES/DRESSINGS) ×3 IMPLANT
GAUZE SPONGE 4X4 16PLY XRAY LF (GAUZE/BANDAGES/DRESSINGS) ×3 IMPLANT
GAUZE XEROFORM 1X8 LF (GAUZE/BANDAGES/DRESSINGS) ×3 IMPLANT
GLOVE SURG SYN 8.0 (GLOVE) ×6 IMPLANT
GOWN STRL REIN XL XLG (GOWN DISPOSABLE) ×3 IMPLANT
GOWN STRL REUS W/ TWL LRG LVL3 (GOWN DISPOSABLE) ×1 IMPLANT
GOWN STRL REUS W/TWL LRG LVL3 (GOWN DISPOSABLE) ×3
K-WIRE .045X4 (WIRE) ×9 IMPLANT
NEEDLE HYPO 25X1 1.5 SAFETY (NEEDLE) ×3 IMPLANT
NS IRRIG 500ML POUR BTL (IV SOLUTION) ×3 IMPLANT
PACK BASIN DAY SURGERY FS (CUSTOM PROCEDURE TRAY) ×3 IMPLANT
PAD CAST 3X4 CTTN HI CHSV (CAST SUPPLIES) ×1 IMPLANT
PAD CAST 4YDX4 CTTN HI CHSV (CAST SUPPLIES) ×1 IMPLANT
PADDING CAST ABS 4INX4YD NS (CAST SUPPLIES) ×2
PADDING CAST ABS COTTON 4X4 ST (CAST SUPPLIES) ×1 IMPLANT
PADDING CAST COTTON 3X4 STRL (CAST SUPPLIES) ×3
PADDING CAST COTTON 4X4 STRL (CAST SUPPLIES) ×3
SPLINT PLASTER CAST XFAST 4X15 (CAST SUPPLIES) ×15 IMPLANT
SPLINT PLASTER XTRA FAST SET 4 (CAST SUPPLIES) ×30
STOCKINETTE 4X48 STRL (DRAPES) ×3 IMPLANT
SUCTION FRAZIER HANDLE 10FR (MISCELLANEOUS) ×2
SUCTION TUBE FRAZIER 10FR DISP (MISCELLANEOUS) ×1 IMPLANT
SUT ETHIBOND 0 (SUTURE) ×6 IMPLANT
SUT ETHILON 4 0 PS 2 18 (SUTURE) ×12 IMPLANT
SYR 10ML LL (SYRINGE) ×3 IMPLANT
SYR BULB 3OZ (MISCELLANEOUS) ×3 IMPLANT
TOWEL OR 17X24 6PK STRL BLUE (TOWEL DISPOSABLE) ×9 IMPLANT
TUBE CONNECTING 12'X1/4 (SUCTIONS) ×1
TUBE CONNECTING 12X1/4 (SUCTIONS) ×2 IMPLANT

## 2017-12-10 NOTE — Anesthesia Preprocedure Evaluation (Addendum)
Anesthesia Evaluation  Patient identified by MRN, date of birth, ID band Patient awake    Reviewed: Allergy & Precautions, NPO status , Patient's Chart, lab work & pertinent test results  Airway Mallampati: II  TM Distance: >3 FB Neck ROM: Full    Dental  (+) Teeth Intact, Dental Advisory Given   Pulmonary neg pulmonary ROS,    breath sounds clear to auscultation       Cardiovascular negative cardio ROS   Rhythm:Regular Rate:Normal     Neuro/Psych    GI/Hepatic negative GI ROS, Neg liver ROS,   Endo/Other  negative endocrine ROS  Renal/GU negative Renal ROS     Musculoskeletal   Abdominal   Peds  Hematology   Anesthesia Other Findings   Reproductive/Obstetrics                            Anesthesia Physical Anesthesia Plan  ASA: I  Anesthesia Plan: General   Post-op Pain Management:    Induction: Intravenous  PONV Risk Score and Plan: 2 and Treatment may vary due to age or medical condition, Ondansetron, Dexamethasone and Midazolam  Airway Management Planned: LMA  Additional Equipment:   Intra-op Plan:   Post-operative Plan: Extubation in OR  Informed Consent: I have reviewed the patients History and Physical, chart, labs and discussed the procedure including the risks, benefits and alternatives for the proposed anesthesia with the patient or authorized representative who has indicated his/her understanding and acceptance.   Dental advisory given  Plan Discussed with: CRNA and Anesthesiologist  Anesthesia Plan Comments:         Anesthesia Quick Evaluation
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
## 2017-12-10 NOTE — Transfer of Care (Signed)
Immediate Anesthesia Transfer of Care Note  Patient: Kurt Sullivan  Procedure(s) Performed: OPEN REDUCTION INTERNAL FIXATION (ORIF) LEFT WRIST PERILUNATE DISLOCATION (Left Wrist)  Patient Location: PACU  Anesthesia Type:General  Level of Consciousness: awake, alert  and oriented  Airway & Oxygen Therapy: Patient Spontanous Breathing and Patient connected to nasal cannula oxygen  Post-op Assessment: Report given to RN and Post -op Vital signs reviewed and stable  Post vital signs: Reviewed and stable  Last Vitals:  Vitals Value Taken Time  BP 120/73 12/10/2017  9:12 AM  Temp    Pulse 88 12/10/2017  9:14 AM  Resp 16 12/10/2017  9:14 AM  SpO2 100 % 12/10/2017  9:14 AM  Vitals shown include unvalidated device data.  Last Pain:  Vitals:   12/10/17 0623  TempSrc:   PainSc: 0-No pain      Patients Stated Pain Goal: 7 (12/10/17 4098)  Complications: No apparent anesthesia complications

## 2017-12-10 NOTE — Anesthesia Postprocedure Evaluation (Signed)
Anesthesia Post Note  Patient: Kurt Sullivan  Procedure(s) Performed: OPEN REDUCTION INTERNAL FIXATION (ORIF) LEFT WRIST PERILUNATE DISLOCATION (Left Wrist)     Patient location during evaluation: PACU Anesthesia Type: General Level of consciousness: awake Pain management: pain level controlled Vital Signs Assessment: post-procedure vital signs reviewed and stable Respiratory status: spontaneous breathing Cardiovascular status: stable Anesthetic complications: no    Last Vitals:  Vitals:   12/10/17 0930 12/10/17 0945  BP: 138/86 121/69  Pulse: 91 78  Resp: 20 20  Temp:    SpO2: 100% 99%    Last Pain:  Vitals:   12/10/17 0945  TempSrc:   PainSc: 8                  Euclide Granito

## 2017-12-10 NOTE — Anesthesia Procedure Notes (Signed)
Procedure Name: LMA Insertion Date/Time: 12/10/2017 7:34 AM Performed by: Tyrone Nine, CRNA Pre-anesthesia Checklist: Patient identified, Timeout performed, Emergency Drugs available, Suction available and Patient being monitored Patient Re-evaluated:Patient Re-evaluated prior to induction Oxygen Delivery Method: Circle system utilized Preoxygenation: Pre-oxygenation with 100% oxygen Induction Type: IV induction Ventilation: Mask ventilation without difficulty LMA: LMA inserted LMA Size: 4.0 Number of attempts: 1 Placement Confirmation: positive ETCO2,  CO2 detector and breath sounds checked- equal and bilateral Tube secured with: Tape Dental Injury: Teeth and Oropharynx as per pre-operative assessment

## 2017-12-10 NOTE — Op Note (Signed)
Kurt Sullivan, Kurt Sullivan MEDICAL RECORD [id number] ACCOUNT [id number] DATE OF BIRTH:[birth year] FACILITY: [facility] LOCATION: [facility] PHYSICIAN:Iliana Hutt A. Mina Marble, MD  OPERATIVE REPORT  DATE OF PROCEDURE:  12/10/2017  PREOPERATIVE DIAGNOSIS:  Left wrist perilunate dislocation with carpal tunnel symptomatology.  POSTOPERATIVE DIAGNOSIS:  Left wrist perilunate dislocation with carpal tunnel symptomatology.  PROCEDURE:  Open treatment of left wrist perilunate dislocation with carpal tunnel release and pinning of scapholunate and scaphoid capitate joint.  SURGEON:  Dairl Ponder, MD  ASSISTANT:  Molly Maduro ____, PA-C  ANESTHESIA:  General.  COMPLICATIONS:  None.  DRAINS:  None.  DESCRIPTION OF PROCEDURE:  The patient was taken to the operating suite after induction of adequate general anesthetic.  Left upper extremity was prepped and draped in the usual sterile fashion.  An Esmarch was used to exsanguinate the limb.  Tourniquet  was then inflated to 250 mmHg.  At this point in time, an extended carpal tunnel incision was made on the palmar aspect of the left wrist.  Skin was incised 5 to 6 cm.  Dissection was carried down to the area of the palmaris longus tendon.  This was  carefully retracted radially.  The median nerve was identified proximally.  We decompressed the carpal tunnel from proximal to distal in open fashion.  There was significant pressure and blood in the carpal canal.  We then retracted the contents of the  carpal canal to the radial side and repaired the transverse tear of the volar wrist ligaments with 0 Ethibond suture x4 horizontal mattress sutures.  We then irrigated and loosely closed the skin with 4-0 nylon.  We made a second incision over the dorsal  radial aspect of the wrist and dissected down to the level of the radial styloid and proximal and distal pole of the scaphoid.  Then, under direct and fluoroscopic guidance, we pinned the scaphoid to the lunate with 2 K  wires and the distal pole of the  scaphoid to the capitate with 1 wire.  Fluoroscopic imaging revealed adequate reduction of the scaphoid lunate in both AP and lateral views.  We then removed the retractors, cut the K wires below the skin, irrigated and loosely closed this with 4-0  nylon.  A third incision was made dorsally, and dissection was carried down to the interval between the second, third and fourth dorsal compartments.  We carefully retracted the second and third dorsal compartments radially and the fourth dorsal  compartment ulnarly.  We opened up the dorsal wrist capsule.  There was a midsubstance tear of the scapholunate ligament, which was repaired with 0 Ethibond x2 horizontal mattress sutures.  We then imbricated and repaired the dorsal capsule with the same  Ethibond suture.  We then thoroughly irrigated and loosely closed this incision with 4-0 nylon.  We then placed Xeroform, 4 x 4s, fluffs, and dorsal and volar splints.  The patient tolerated this procedure well and went to recovery room in stable  fashion.  LN/NUANCE  D:12/10/2017 T:12/10/2017 JOB:[id number]

## 2017-12-10 NOTE — Op Note (Signed)
  Please see report #[id number]

## 2017-12-10 NOTE — Discharge Instructions (Signed)

## 2017-12-13 ENCOUNTER — Encounter (HOSPITAL_BASED_OUTPATIENT_CLINIC_OR_DEPARTMENT_OTHER): Payer: Self-pay | Admitting: Orthopedic Surgery

## 2017-12-16 ENCOUNTER — Ambulatory Visit (HOSPITAL_COMMUNITY)
Admission: EM | Admit: 2017-12-16 | Discharge: 2017-12-16 | Disposition: A | Discharge status: Home / Self Care | Payer: Medicaid Other

## 2017-12-16 ENCOUNTER — Encounter (HOSPITAL_COMMUNITY): Payer: Self-pay | Admitting: Family Medicine

## 2017-12-16 DIAGNOSIS — R21 Rash and other nonspecific skin eruption: Secondary | ICD-10-CM

## 2017-12-16 MED ORDER — PREDNISONE 50 MG PO TABS: 50.0000 mg | ORAL_TABLET | Freq: Every day | ORAL | 0 refills | Status: AC

## 2017-12-16 MED ORDER — OLOPATADINE HCL 0.1 % OP SOLN: 1.0000 [drp] | Freq: Two times a day (BID) | OPHTHALMIC | 0 refills | Status: DC

## 2017-12-16 MED ORDER — CETIRIZINE HCL 10 MG PO CAPS: 10.0000 mg | ORAL_CAPSULE | Freq: Every day | ORAL | 0 refills | Status: DC

## 2017-12-16 NOTE — Discharge Instructions (Signed)
Please follow-up with orthopedics on 5/14  Begin prednisone daily for the next 5 days, take with food  Please take daily Zyrtec, may supplement with Benadryl for further itching.  Please contact Larita Fife at Dr. Ronie Spies office if rash is worsening
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
## 2018-01-19 ENCOUNTER — Other Ambulatory Visit: Payer: Self-pay | Admitting: Orthopedic Surgery

## 2018-02-01 ENCOUNTER — Encounter (HOSPITAL_BASED_OUTPATIENT_CLINIC_OR_DEPARTMENT_OTHER): Payer: Self-pay | Admitting: *Deleted

## 2018-02-01 ENCOUNTER — Other Ambulatory Visit: Payer: Self-pay

## 2018-02-01 NOTE — Progress Notes (Signed)
Spoke w/ pt via phone for pre-op interview.  Npo after mn.  Arrive at 0930. 

## 2018-02-07 ENCOUNTER — Ambulatory Visit (HOSPITAL_BASED_OUTPATIENT_CLINIC_OR_DEPARTMENT_OTHER): Payer: Self-pay | Admitting: Anesthesiology

## 2018-02-07 ENCOUNTER — Encounter (HOSPITAL_BASED_OUTPATIENT_CLINIC_OR_DEPARTMENT_OTHER): Payer: Self-pay | Admitting: Anesthesiology

## 2018-02-07 ENCOUNTER — Encounter (HOSPITAL_BASED_OUTPATIENT_CLINIC_OR_DEPARTMENT_OTHER)
Admission: RE | Disposition: A | Discharge status: Home / Self Care | Payer: Self-pay | Source: Ambulatory Visit | Attending: Orthopedic Surgery

## 2018-02-07 ENCOUNTER — Ambulatory Visit (HOSPITAL_BASED_OUTPATIENT_CLINIC_OR_DEPARTMENT_OTHER)
Admission: RE | Admit: 2018-02-07 | Discharge: 2018-02-07 | Disposition: A | Discharge status: Home / Self Care | Payer: Self-pay | Source: Ambulatory Visit | Attending: Orthopedic Surgery | Admitting: Orthopedic Surgery

## 2018-02-07 DIAGNOSIS — Z472 Encounter for removal of internal fixation device: Secondary | ICD-10-CM | POA: Insufficient documentation

## 2018-02-07 HISTORY — PX: EXTREMITY WIRE/PIN REMOVAL: SHX5051

## 2018-02-07 SURGERY — REMOVAL, PIN, EXTREMITY
Anesthesia: General | Site: Wrist | Laterality: Left

## 2018-02-07 SURGERY — MINOR HARDWARE REMOVAL
Anesthesia: General | Laterality: Left

## 2018-02-07 MED ORDER — MIDAZOLAM HCL 5 MG/5ML IJ SOLN
INTRAMUSCULAR | Status: DC | PRN
  Administered 2018-02-07: 2 mg via INTRAVENOUS

## 2018-02-07 MED ORDER — FENTANYL CITRATE (PF) 100 MCG/2ML IJ SOLN
INTRAMUSCULAR | Status: DC | PRN
  Administered 2018-02-07 (×2): 25 ug via INTRAVENOUS
  Administered 2018-02-07: 50 ug via INTRAVENOUS

## 2018-02-07 MED ORDER — BUPIVACAINE HCL (PF) 0.25 % IJ SOLN
INTRAMUSCULAR | Status: DC | PRN
  Administered 2018-02-07: 8 mL

## 2018-02-07 MED ORDER — PROPOFOL 10 MG/ML IV BOLUS
INTRAVENOUS | Status: AC
  Filled 2018-02-07: qty 20

## 2018-02-07 MED ORDER — OXYCODONE HCL 5 MG PO TABS
5.0000 mg | ORAL_TABLET | Freq: Once | ORAL | Status: DC | PRN
  Filled 2018-02-07: qty 1

## 2018-02-07 MED ORDER — LIDOCAINE 2% (20 MG/ML) 5 ML SYRINGE
INTRAMUSCULAR | Status: AC
  Filled 2018-02-07: qty 5

## 2018-02-07 MED ORDER — PROPOFOL 10 MG/ML IV BOLUS
INTRAVENOUS | Status: DC | PRN
  Administered 2018-02-07: 100 mg via INTRAVENOUS
  Administered 2018-02-07: 200 mg via INTRAVENOUS

## 2018-02-07 MED ORDER — LACTATED RINGERS IV SOLN
INTRAVENOUS | Status: DC
  Filled 2018-02-07: qty 1000

## 2018-02-07 MED ORDER — OXYCODONE HCL 5 MG/5ML PO SOLN
5.0000 mg | Freq: Once | ORAL | Status: DC | PRN
  Filled 2018-02-07: qty 5

## 2018-02-07 MED ORDER — CEFAZOLIN SODIUM-DEXTROSE 2-4 GM/100ML-% IV SOLN
INTRAVENOUS | Status: AC
Start: 2018-02-07 — End: ?
  Filled 2018-02-07: qty 100

## 2018-02-07 MED ORDER — CHLORHEXIDINE GLUCONATE 4 % EX LIQD
60.0000 mL | Freq: Once | CUTANEOUS | Status: DC
  Filled 2018-02-07: qty 118

## 2018-02-07 MED ORDER — LACTATED RINGERS IV SOLN
INTRAVENOUS | Status: DC
  Administered 2018-02-07: 10:00:00 via INTRAVENOUS
  Filled 2018-02-07: qty 1000

## 2018-02-07 MED ORDER — FENTANYL CITRATE (PF) 100 MCG/2ML IJ SOLN
INTRAMUSCULAR | Status: AC
  Filled 2018-02-07: qty 2

## 2018-02-07 MED ORDER — ONDANSETRON HCL 4 MG/2ML IJ SOLN
INTRAMUSCULAR | Status: DC | PRN
  Administered 2018-02-07: 4 mg via INTRAVENOUS

## 2018-02-07 MED ORDER — MEPERIDINE HCL 25 MG/ML IJ SOLN
6.2500 mg | INTRAMUSCULAR | Status: DC | PRN
  Filled 2018-02-07: qty 1

## 2018-02-07 MED ORDER — DEXAMETHASONE SODIUM PHOSPHATE 10 MG/ML IJ SOLN
INTRAMUSCULAR | Status: AC
  Filled 2018-02-07: qty 1

## 2018-02-07 MED ORDER — LIDOCAINE HCL (CARDIAC) PF 100 MG/5ML IV SOSY
PREFILLED_SYRINGE | INTRAVENOUS | Status: DC | PRN
  Administered 2018-02-07: 100 mg via INTRAVENOUS

## 2018-02-07 MED ORDER — KETOROLAC TROMETHAMINE 30 MG/ML IJ SOLN
INTRAMUSCULAR | Status: AC
  Filled 2018-02-07: qty 1

## 2018-02-07 MED ORDER — ONDANSETRON HCL 4 MG/2ML IJ SOLN
INTRAMUSCULAR | Status: AC
  Filled 2018-02-07: qty 2

## 2018-02-07 MED ORDER — CEFAZOLIN SODIUM-DEXTROSE 2-4 GM/100ML-% IV SOLN
2.0000 g | INTRAVENOUS | Status: AC
  Administered 2018-02-07: 2 g via INTRAVENOUS
  Filled 2018-02-07: qty 100

## 2018-02-07 MED ORDER — KETOROLAC TROMETHAMINE 60 MG/2ML IM SOLN: INTRAMUSCULAR | Status: DC | PRN

## 2018-02-07 MED ORDER — KETOROLAC TROMETHAMINE 30 MG/ML IJ SOLN
INTRAMUSCULAR | Status: DC | PRN
  Administered 2018-02-07: 30 mg via INTRAVENOUS

## 2018-02-07 MED ORDER — FENTANYL CITRATE (PF) 100 MCG/2ML IJ SOLN
25.0000 ug | INTRAMUSCULAR | Status: DC | PRN
  Administered 2018-02-07: 50 ug via INTRAVENOUS
  Filled 2018-02-07: qty 1

## 2018-02-07 MED ORDER — PROMETHAZINE HCL 25 MG/ML IJ SOLN
6.2500 mg | INTRAMUSCULAR | Status: DC | PRN
  Filled 2018-02-07: qty 1

## 2018-02-07 MED ORDER — MIDAZOLAM HCL 2 MG/2ML IJ SOLN
INTRAMUSCULAR | Status: AC
  Filled 2018-02-07: qty 2

## 2018-02-07 MED ORDER — DEXAMETHASONE SODIUM PHOSPHATE 4 MG/ML IJ SOLN
INTRAMUSCULAR | Status: DC | PRN
  Administered 2018-02-07: 10 mg via INTRAVENOUS

## 2018-02-07 SURGICAL SUPPLY — 27 items
BANDAGE ELASTIC 4 VELCRO ST LF (GAUZE/BANDAGES/DRESSINGS) ×2 IMPLANT
BLADE SURG 15 STRL LF DISP TIS (BLADE) ×1 IMPLANT
BLADE SURG 15 STRL SS (BLADE) ×2
BNDG CMPR 9X4 STRL LF SNTH (GAUZE/BANDAGES/DRESSINGS) ×1
BNDG ESMARK 4X9 LF (GAUZE/BANDAGES/DRESSINGS) ×2 IMPLANT
BNDG GAUZE ELAST 4 BULKY (GAUZE/BANDAGES/DRESSINGS) ×2 IMPLANT
CORD BIPOLAR FORCEPS 12FT (ELECTRODE) ×2 IMPLANT
COVER BACK TABLE 60X90IN (DRAPES) ×2 IMPLANT
CUFF TOURNIQUET SINGLE 18IN (TOURNIQUET CUFF) ×2 IMPLANT
DRAPE EXTREMITY T 121X128X90 (DRAPE) ×2 IMPLANT
DRAPE LG THREE QUARTER DISP (DRAPES) ×4 IMPLANT
DRAPE SURG 17X23 STRL (DRAPES) ×2 IMPLANT
DURAPREP 26ML APPLICATOR (WOUND CARE) ×2 IMPLANT
GAUZE SPONGE 4X4 12PLY STRL LF (GAUZE/BANDAGES/DRESSINGS) ×2 IMPLANT
GAUZE XEROFORM 1X8 LF (GAUZE/BANDAGES/DRESSINGS) ×2 IMPLANT
GLOVE SURG SYN 8.0 (GLOVE) ×2 IMPLANT
GOWN STRL REUS W/TWL XL LVL3 (GOWN DISPOSABLE) ×4 IMPLANT
PACK BASIN DAY SURGERY FS (CUSTOM PROCEDURE TRAY) ×2 IMPLANT
PAD CAST 4YDX4 CTTN HI CHSV (CAST SUPPLIES) ×1 IMPLANT
PADDING CAST COTTON 4X4 STRL (CAST SUPPLIES) ×2
SPLINT PLASTER CAST XFAST 4X15 (CAST SUPPLIES) ×1 IMPLANT
SPLINT PLASTER XTRA FAST SET 4 (CAST SUPPLIES) ×1
STOCKINETTE 4X48 STRL (DRAPES) ×2 IMPLANT
SUT ETHILON 4 0 PS 2 18 (SUTURE) ×2 IMPLANT
SYR 10ML LL (SYRINGE) ×2 IMPLANT
SYR BULB 3OZ (MISCELLANEOUS) ×2 IMPLANT
TOWEL OR 17X24 6PK STRL BLUE (TOWEL DISPOSABLE) ×2 IMPLANT

## 2018-02-07 NOTE — Op Note (Signed)
Please see dictated report [id number]

## 2018-02-07 NOTE — Discharge Instructions (Signed)
Post Anesthesia Home Care Instructions  Activity: Get plenty of rest for the remainder of the day. A responsible individual must stay with you for 24 hours following the procedure.  For the next 24 hours, DO NOT: -Drive a car -Advertising copywriterperate machinery -Drink alcoholic beverages -Take any medication unless instructed by your physician -Make any legal decisions or sign important papers.  Meals: Start with liquid foods such as gelatin or soup. Progress to regular foods as tolerated. Avoid greasy, spicy, heavy foods. If nausea and/or vomiting occur, drink only clear liquids until the nausea and/or vomiting subsides. Call your physician if vomiting continues.  You may take ibuprofen AFTER 6 pm today.  Special Instructions/Symptoms: Your throat may feel dry or sore from the anesthesia or the breathing tube placed in your throat during surgery. If this causes discomfort, gargle with warm salt water. The discomfort should disappear within 24 hours.

## 2018-02-07 NOTE — Anesthesia Procedure Notes (Signed)
Procedure Name: LMA Insertion Date/Time: 02/07/2018 11:49 AM Performed by: Jessica PriestBeeson, Tam Delisle C, CRNA Pre-anesthesia Checklist: Patient identified, Emergency Drugs available, Suction available and Patient being monitored Patient Re-evaluated:Patient Re-evaluated prior to induction Oxygen Delivery Method: Circle system utilized Preoxygenation: Pre-oxygenation with 100% oxygen Induction Type: IV induction Ventilation: Mask ventilation without difficulty LMA: LMA inserted LMA Size: 4.0 Number of attempts: 1 Airway Equipment and Method: Bite block Placement Confirmation: positive ETCO2 and breath sounds checked- equal and bilateral Tube secured with: Tape Dental Injury: Teeth and Oropharynx as per pre-operative assessment
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
## 2018-02-07 NOTE — Op Note (Signed)
NAMDespina Hidden: Fogg, Rino MEDICAL RECORD [id number] ACCOUNT [id number] DATE OF BIRTH:1996-03-10 FACILITY: [facility] LOCATION: [facility] PHYSICIAN:Pistol Kessenich A. Mina MarbleWEINGOLD, MD  OPERATIVE REPORT  DATE OF PROCEDURE:  02/07/2018  PREOPERATIVE DIAGNOSIS:  Retained hardware, left wrist.  POSTOPERATIVE DIAGNOSIS:  Retained hardware, left wrist.  PROCEDURE:  Removal of hardware, deep left wrist.  SURGEON:  Dairl PonderMatthew Jaeden Westbay, MD  ANESTHESIA:  General.  COMPLICATIONS:  None.   DRAINS:  None.  DESCRIPTION OF PROCEDURE:  The patient was taken to the operating suite after induction of adequate general anesthetic.  Left upper extremity was prepped and draped in the usual sterile fashion.  An Esmarch was used to exsanguinate the limb, and the  tourniquet was inflated to 250 mmHg.  At this point in time, using his previous incision over the dorsal radial aspect of the left wrist, the skin was incised sharply for 2 cm.  Blunt dissection was carried down to the tip of the radial styloid.  Here,  we encountered three 0.045 K wires that were removed with no undue difficulty.  The wound was irrigated and loosely closed with 4-0 nylon.  Xeroform, 4 x 4's, fluffs, and a radial gutter splint was applied.  The patient tolerated this procedure well and  went to recovery room in stable fashion.  LN/NUANCE  D:02/07/2018 T:02/07/2018 JOB:[id number]

## 2018-02-07 NOTE — Transfer of Care (Signed)
Immediate Anesthesia Transfer of Care Note  Patient: Kurt Sullivan  Procedure(s) Performed: Procedure(s) (LRB): REMOVAL OF BURIED K-WIRES LEFT WRIST (Left)  Patient Location: PACU  Anesthesia Type: General  Level of Consciousness: awake, sedated, patient cooperative and responds to stimulation  Airway & Oxygen Therapy: Patient Spontanous Breathing and Patient connected to [place] O2  Post-op Assessment: Report given to PACU RN, Post -op Vital signs reviewed and stable and Patient moving all extremities  Post vital signs: Reviewed and stable  Complications: No apparent anesthesia complications

## 2018-02-07 NOTE — Anesthesia Preprocedure Evaluation (Addendum)
Anesthesia Evaluation  Patient identified by MRN, date of birth, ID band Patient awake    Reviewed: Allergy & Precautions, NPO status , Patient's Chart, lab work & pertinent test results  Airway Mallampati: I  TM Distance: >3 FB Neck ROM: Full    Dental  (+) Teeth Intact, Dental Advisory Given   Pulmonary neg pulmonary ROS,    breath sounds clear to auscultation       Cardiovascular negative cardio ROS   Rhythm:Regular Rate:Normal     Neuro/Psych negative neurological ROS     GI/Hepatic negative GI ROS, Neg liver ROS,   Endo/Other  negative endocrine ROS  Renal/GU negative Renal ROS     Musculoskeletal negative musculoskeletal ROS (+)   Abdominal Normal abdominal exam  (+)   Peds  Hematology negative hematology ROS (+)   Anesthesia Other Findings   Reproductive/Obstetrics                            Anesthesia Physical Anesthesia Plan  ASA: II  Anesthesia Plan: General   Post-op Pain Management:    Induction: Intravenous  PONV Risk Score and Plan: 3 and Ondansetron, Dexamethasone and Midazolam  Airway Management Planned: LMA  Additional Equipment: None  Intra-op Plan:   Post-operative Plan: Extubation in OR  Informed Consent: I have reviewed the patients History and Physical, chart, labs and discussed the procedure including the risks, benefits and alternatives for the proposed anesthesia with the patient or authorized representative who has indicated his/her understanding and acceptance.   Dental advisory given  Plan Discussed with: CRNA  Anesthesia Plan Comments:        Anesthesia Quick Evaluation

## 2018-02-08 ENCOUNTER — Encounter (HOSPITAL_BASED_OUTPATIENT_CLINIC_OR_DEPARTMENT_OTHER): Payer: Self-pay | Admitting: Orthopedic Surgery

## 2018-02-08 NOTE — Anesthesia Postprocedure Evaluation (Signed)
Anesthesia Post Note  Patient: Despina HiddenRashad Gaughran  Procedure(s) Performed: REMOVAL OF BURIED K-WIRES LEFT WRIST (Left Wrist)     Patient location during evaluation: PACU Anesthesia Type: General Level of consciousness: awake and alert Pain management: pain level controlled Vital Signs Assessment: post-procedure vital signs reviewed and stable Respiratory status: spontaneous breathing, nonlabored ventilation, respiratory function stable and patient connected to nasal cannula oxygen Cardiovascular status: blood pressure returned to baseline and stable Postop Assessment: no apparent nausea or vomiting Anesthetic complications: no    Last Vitals:  Vitals:   02/07/18 1300 02/07/18 1340  BP: 131/77 115/69  Pulse: 62 (!) 49  Resp: 17 14  Temp:  36.6 C  SpO2: 99% 100%    Last Pain:  Vitals:   02/07/18 1340  TempSrc: Oral  PainSc: 0-No pain                 Shelton SilvasKevin D Hollis
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
## 2018-09-19 IMAGING — DX DG HAND 2V*L*
1 series · 2 of 2 positions shown · non-contrast
Comparison: 12/05/2017

CLINICAL DATA: Postreduction left hand.  No

EXAM:
LEFT HAND - 2 VIEW

[Series 1: hand · 0.14mm/px · 2 of 2 slices shown]
[im 1/2]
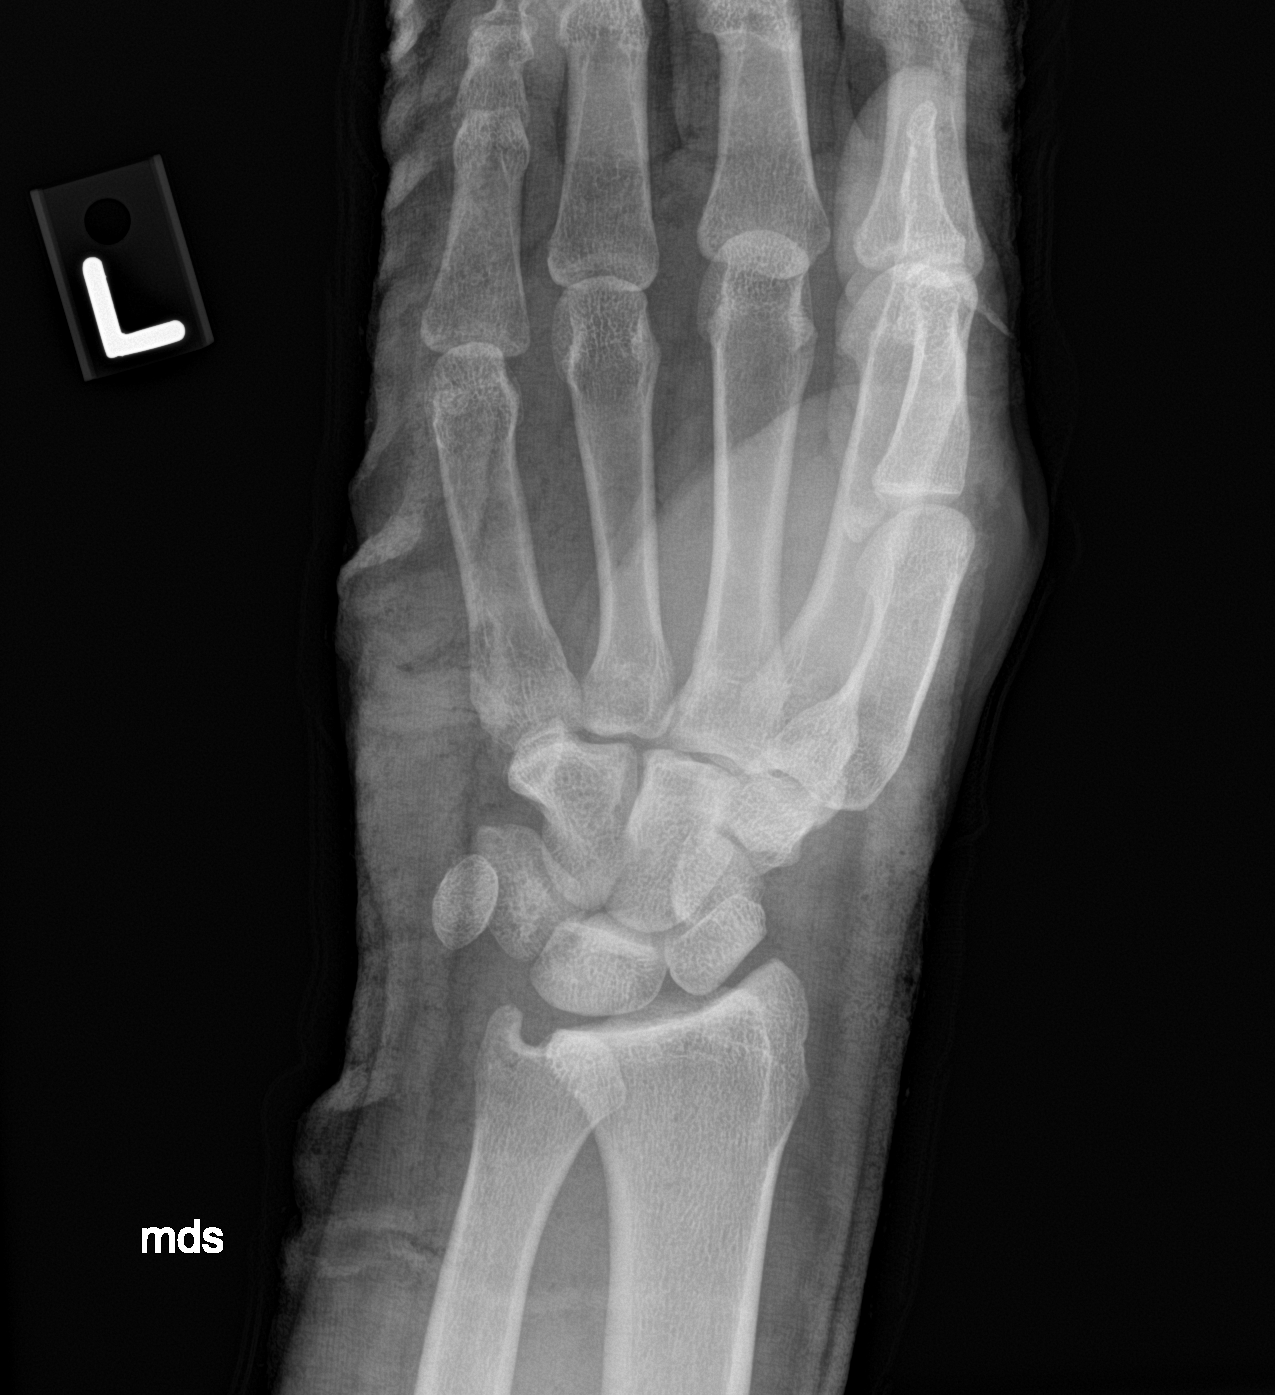
[im 2/2]
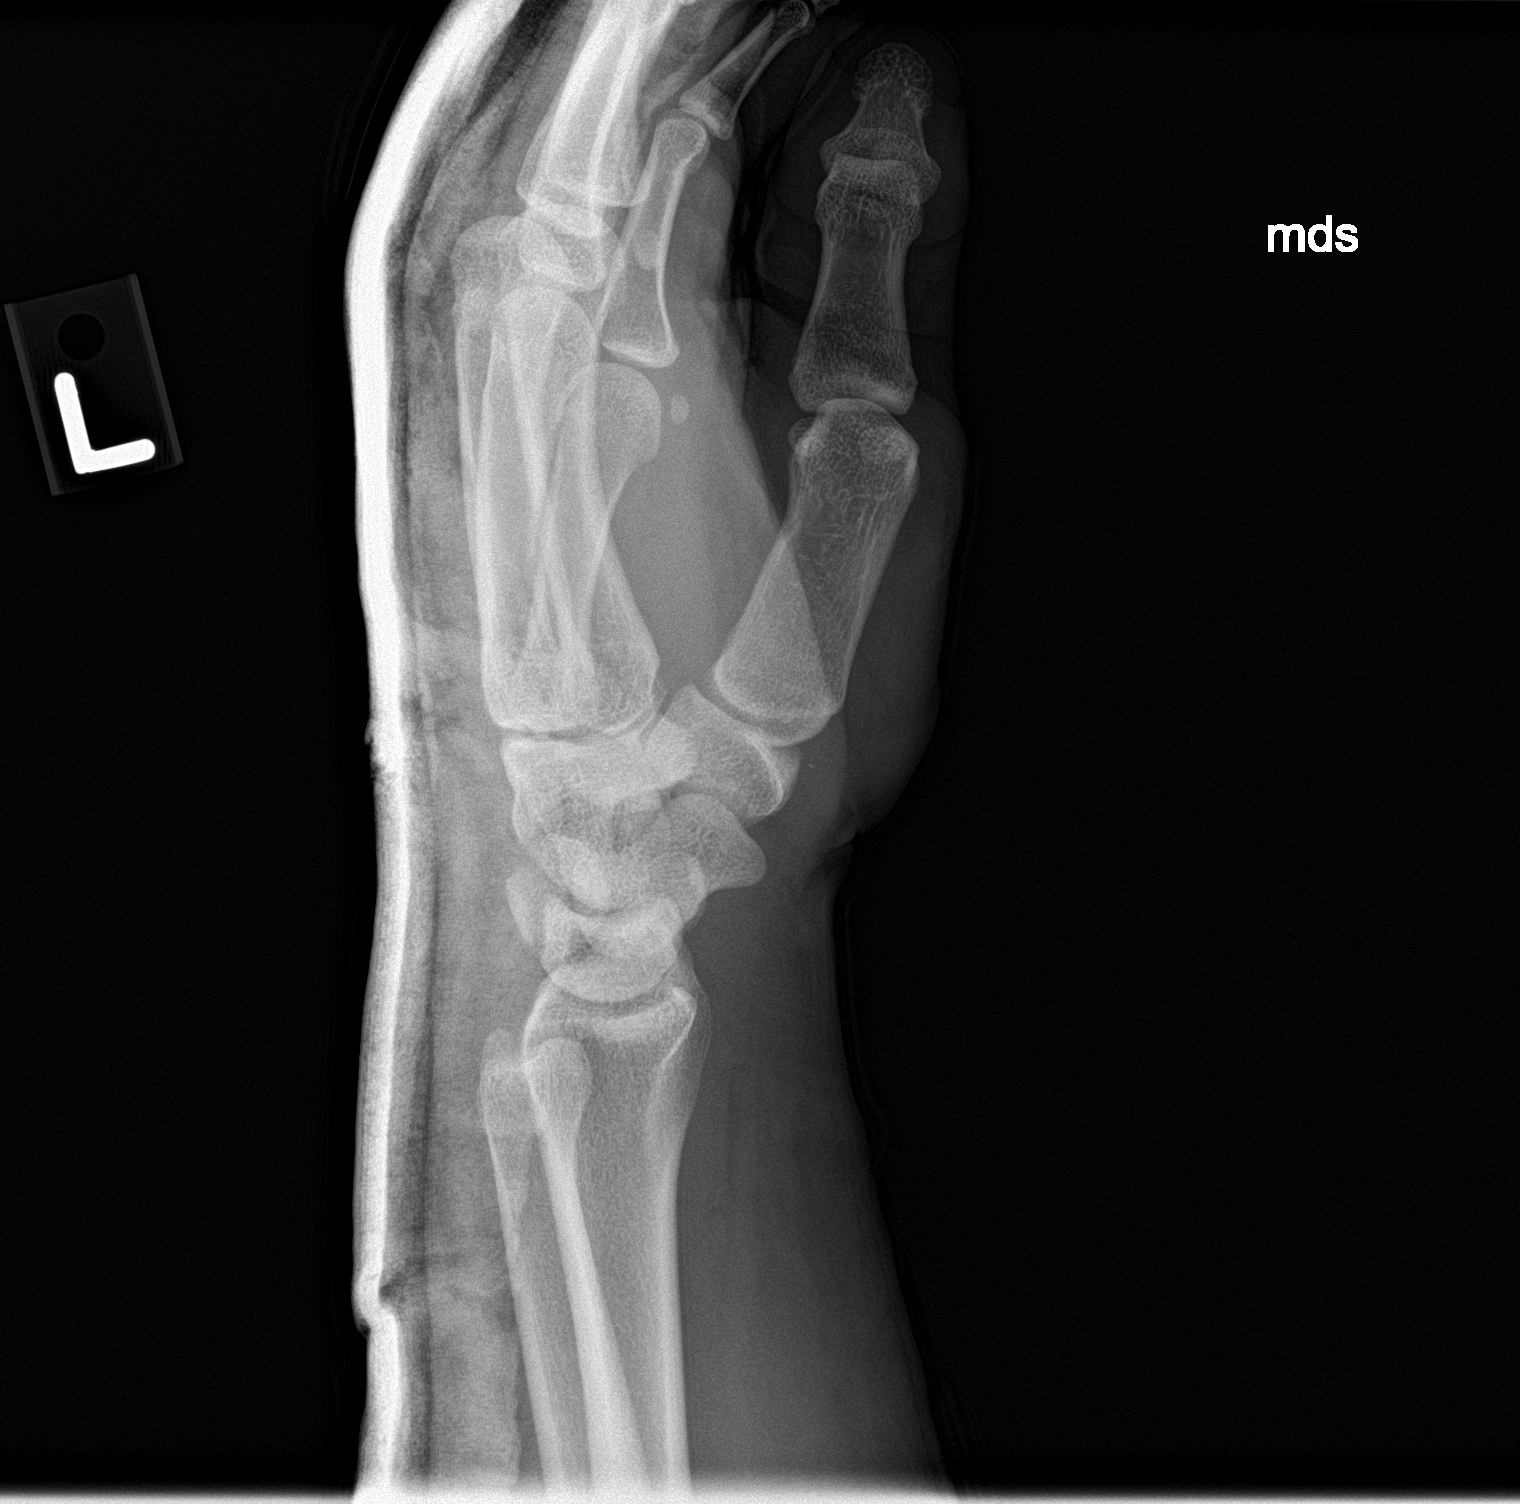

[2 of 2 positions shown; findings below may reference images not displayed]

FINDINGS: Overlying splint material is placed which obscures bone detail.
Normal alignment of the lunate with respect to the capitate and
distal radius suggesting successful reduction. No discrete fractures
identified.
IMPRESSION: Anatomic alignment of the carpal bones suggesting successful
reduction.

## 2019-02-13 ENCOUNTER — Other Ambulatory Visit: Payer: Self-pay

## 2019-02-13 ENCOUNTER — Ambulatory Visit (HOSPITAL_COMMUNITY)
Admission: EM | Admit: 2019-02-13 | Discharge: 2019-02-13 | Disposition: A | Discharge status: Home / Self Care | Payer: Medicaid Other | Attending: Family Medicine | Admitting: Family Medicine

## 2019-02-13 ENCOUNTER — Encounter (HOSPITAL_COMMUNITY): Payer: Self-pay

## 2019-02-13 DIAGNOSIS — R369 Urethral discharge, unspecified: Secondary | ICD-10-CM | POA: Insufficient documentation

## 2019-02-13 MED ORDER — CEFTRIAXONE SODIUM 250 MG IJ SOLR
250.0000 mg | Freq: Once | INTRAMUSCULAR | Status: AC
  Administered 2019-02-13: 250 mg via INTRAMUSCULAR

## 2019-02-13 MED ORDER — AZITHROMYCIN 250 MG PO TABS
1000.0000 mg | ORAL_TABLET | Freq: Once | ORAL | Status: AC
  Administered 2019-02-13: 09:00:00 1000 mg via ORAL

## 2019-02-13 MED ORDER — AZITHROMYCIN 250 MG PO TABS
ORAL_TABLET | ORAL | Status: AC
  Filled 2019-02-13: qty 4

## 2019-02-13 MED ORDER — CEFTRIAXONE SODIUM 250 MG IJ SOLR
INTRAMUSCULAR | Status: AC
  Filled 2019-02-13: qty 250

## 2019-02-13 MED ORDER — LIDOCAINE HCL 2 % IJ SOLN
INTRAMUSCULAR | Status: AC
Start: 2019-02-13 — End: ?
  Filled 2019-02-13: qty 20

## 2019-02-13 NOTE — ED Triage Notes (Signed)
Pt presents with penile discharge after exposure from partner.

## 2019-02-13 NOTE — Discharge Instructions (Addendum)
Treating you for gonorrhea and chlamydia.  Sending your urine for testing. Will call with any positive results.
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
## 2019-02-15 LAB — URINE CYTOLOGY ANCILLARY ONLY
Chlamydia: NEGATIVE
Neisseria Gonorrhea: NEGATIVE
Trichomonas: NEGATIVE

## 2021-01-23 ENCOUNTER — Other Ambulatory Visit: Payer: Self-pay

## 2021-01-23 ENCOUNTER — Ambulatory Visit (HOSPITAL_COMMUNITY)
Admission: EM | Admit: 2021-01-23 | Discharge: 2021-01-23 | Disposition: A | Discharge status: Home / Self Care | Payer: Self-pay | Attending: Urgent Care | Admitting: Urgent Care

## 2021-01-23 ENCOUNTER — Encounter (HOSPITAL_COMMUNITY): Payer: Self-pay

## 2021-01-23 DIAGNOSIS — R369 Urethral discharge, unspecified: Secondary | ICD-10-CM | POA: Insufficient documentation

## 2021-01-23 DIAGNOSIS — Z7251 High risk heterosexual behavior: Secondary | ICD-10-CM | POA: Insufficient documentation

## 2021-01-23 MED ORDER — LIDOCAINE HCL (PF) 1 % IJ SOLN
INTRAMUSCULAR | Status: AC
  Filled 2021-01-23: qty 2

## 2021-01-23 MED ORDER — CEFTRIAXONE SODIUM 500 MG IJ SOLR
INTRAMUSCULAR | Status: AC
  Filled 2021-01-23: qty 500

## 2021-01-23 MED ORDER — CEFTRIAXONE SODIUM 500 MG IJ SOLR
500.0000 mg | Freq: Once | INTRAMUSCULAR | Status: AC
  Administered 2021-01-23: 500 mg via INTRAMUSCULAR
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
## 2021-01-23 NOTE — Discharge Instructions (Addendum)
Avoid all forms of sexual intercourse (oral, vaginal, anal) for the next 7 days to avoid spreading/reinfecting. Return if symptoms worsen/do not resolve, you develop fever, abdominal pain, blood in your urine, or are re-exposed to an STI.  

## 2021-01-23 NOTE — ED Triage Notes (Signed)
Pt reports white penile discharge and burning x 4 days.

## 2021-01-24 LAB — CYTOLOGY, (ORAL, ANAL, URETHRAL) ANCILLARY ONLY
Chlamydia: NEGATIVE
Comment: NEGATIVE
Comment: NEGATIVE
Comment: NORMAL
Neisseria Gonorrhea: NEGATIVE
Trichomonas: NEGATIVE

## 2021-03-05 ENCOUNTER — Other Ambulatory Visit: Payer: Self-pay

## 2021-03-05 ENCOUNTER — Ambulatory Visit (HOSPITAL_COMMUNITY)
Admission: EM | Admit: 2021-03-05 | Discharge: 2021-03-05 | Disposition: A | Discharge status: Home / Self Care | Payer: Medicaid Other | Attending: Emergency Medicine | Admitting: Emergency Medicine

## 2021-03-05 ENCOUNTER — Encounter (HOSPITAL_COMMUNITY): Payer: Self-pay

## 2021-03-05 DIAGNOSIS — K047 Periapical abscess without sinus: Secondary | ICD-10-CM

## 2021-03-05 MED ORDER — LIDOCAINE VISCOUS HCL 2 % MT SOLN: 15.0000 mL | OROMUCOSAL | 0 refills | Status: AC | PRN

## 2021-03-05 MED ORDER — AMOXICILLIN-POT CLAVULANATE 875-125 MG PO TABS: 1.0000 | ORAL_TABLET | Freq: Two times a day (BID) | ORAL | 0 refills | Status: AC
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
## 2021-03-05 NOTE — ED Triage Notes (Signed)
Pt presents with left side dental pain X 2 days. 

## 2021-03-05 NOTE — Discharge Instructions (Addendum)
Take the augmentin twice a day for the next 10 days.   Take Ibuprofen and/or Tylenol as needed for pain and fever.  You can also try the viscous lidocaine for pain relief.  Try a soft diet until evaluated by dentist.  Maintain oral hygiene care.  Follow up with dentist as soon as possible for further evaluation and treatment .  Return or go to the ED if you have any new or worsening symptoms such as fever, chills, difficulty swallowing, painful swallowing, oral or neck swelling, nausea, vomiting, chest pain, SOB.

## 2023-09-03 ENCOUNTER — Encounter (HOSPITAL_COMMUNITY): Payer: Self-pay | Admitting: Emergency Medicine

## 2023-09-03 ENCOUNTER — Other Ambulatory Visit: Payer: Self-pay

## 2023-09-03 ENCOUNTER — Emergency Department (HOSPITAL_COMMUNITY)
Admission: EM | Admit: 2023-09-03 | Discharge: 2023-09-04 | Disposition: A | Discharge status: Home / Self Care | Payer: Self-pay | Attending: Emergency Medicine | Admitting: Emergency Medicine

## 2023-09-03 DIAGNOSIS — R112 Nausea with vomiting, unspecified: Secondary | ICD-10-CM | POA: Insufficient documentation

## 2023-09-03 DIAGNOSIS — Z20822 Contact with and (suspected) exposure to covid-19: Secondary | ICD-10-CM | POA: Insufficient documentation

## 2023-09-03 DIAGNOSIS — J101 Influenza due to other identified influenza virus with other respiratory manifestations: Secondary | ICD-10-CM | POA: Insufficient documentation

## 2023-09-03 LAB — RESP PANEL BY RT-PCR (RSV, FLU A&B, COVID)  RVPGX2
Influenza A by PCR: POSITIVE — AB
Influenza B by PCR: NEGATIVE
Resp Syncytial Virus by PCR: NEGATIVE
SARS Coronavirus 2 by RT PCR: NEGATIVE

## 2023-09-03 MED ORDER — ACETAMINOPHEN 325 MG PO TABS
650.0000 mg | ORAL_TABLET | Freq: Once | ORAL | Status: AC
  Administered 2023-09-03: 650 mg via ORAL
  Filled 2023-09-03: qty 2

## 2023-09-03 NOTE — ED Triage Notes (Signed)
Patient c/o flu like symptoms. Patient worsening headache tonight. Patient report n/v x2 today.

## 2023-09-04 MED ORDER — IBUPROFEN 800 MG PO TABS
800.0000 mg | ORAL_TABLET | Freq: Once | ORAL | Status: AC
  Administered 2023-09-04: 800 mg via ORAL
  Filled 2023-09-04: qty 1

## 2023-09-04 MED ORDER — ONDANSETRON HCL 4 MG PO TABS: 4.0000 mg | ORAL_TABLET | Freq: Three times a day (TID) | ORAL | 0 refills | Status: AC | PRN

## 2023-09-04 MED ORDER — ONDANSETRON 8 MG PO TBDP
8.0000 mg | ORAL_TABLET | Freq: Once | ORAL | Status: AC
  Administered 2023-09-04: 8 mg via ORAL
  Filled 2023-09-04: qty 1

## 2023-09-04 NOTE — ED Provider Notes (Signed)
[place] EMERGENCY DEPARTMENT AT [facility] Provider Note   CSN: [id number] Arrival date & time: 09/03/23  2038     History  Chief Complaint  Patient presents with   Flu like symptoms    Kurt Sullivan is a 28 y.o. male.  Patient presents to the emergency room complaining of upper respiratory symptoms including cough, congestion, body aches and headache for 2 days.  Patient also reports nausea and vomiting with 2 episodes of emesis today.  Patient endorses subjective fever at home.  He denies shortness of breath, chest pain, abdominal pain, urinary symptoms.  Past medical history noncontributory.  HPI     Home Medications Prior to Admission medications   Medication Sig Start Date End Date Taking? Authorizing Provider  amoxicillin-clavulanate (AUGMENTIN) 875-125 MG tablet Take 1 tablet by mouth every 12 (twelve) hours. 03/05/21   Ivette Loyal, NP  lidocaine (XYLOCAINE) 2 % solution Use as directed 15 mLs in the mouth or throat as needed for mouth pain. 03/05/21   Ivette Loyal, NP  ondansetron (ZOFRAN) 4 MG tablet Take 1 tablet (4 mg total) by mouth every 8 (eight) hours as needed for nausea or vomiting. 09/04/23  Yes Darrick Grinder, PA-C  vitamin B-12 (CYANOCOBALAMIN) 1000 MCG tablet Take 10,000 mcg by mouth daily as needed (when working out).     [provider]      Allergies    Patient has no known allergies.    Review of Systems   Review of Systems  Physical Exam Updated Vital Signs BP 122/70 (BP Location: Right Arm)   Pulse 100   Temp 99.5 F (37.5 C) (Oral)   Resp 16   Ht 5\' 7"  (1.702 m)   Wt 87.1 kg   SpO2 97%   BMI 30.07 kg/m  Physical Exam Vitals and nursing note reviewed.  HENT:     Head: Normocephalic and atraumatic.  Eyes:     Conjunctiva/sclera: Conjunctivae normal.  Cardiovascular:     Rate and Rhythm: Normal rate.  Pulmonary:     Effort: Pulmonary effort is normal. No respiratory distress.  Musculoskeletal:         General: No signs of injury.     Cervical back: Normal range of motion.  Skin:    General: Skin is dry.  Neurological:     Mental Status: He is alert.  Psychiatric:        Speech: Speech normal.        Behavior: Behavior normal.     ED Results / Procedures / Treatments   Labs (all labs ordered are listed, but only abnormal results are displayed) Labs Reviewed  RESP PANEL BY RT-PCR (RSV, FLU A&B, COVID)  RVPGX2 - Abnormal; Notable for the following components:      Result Value   Influenza A by PCR POSITIVE (*)    All other components within normal limits    EKG None  Radiology No results found.  Procedures Procedures    Medications Ordered in ED Medications  ondansetron (ZOFRAN-ODT) disintegrating tablet 8 mg (has no administration in time range)  ibuprofen (ADVIL) tablet 800 mg (has no administration in time range)  acetaminophen (TYLENOL) tablet 650 mg (650 mg Oral Given 09/03/23 2101)    ED Course/ Medical Decision Making/ A&P                                 Medical Decision Making Risk  OTC drugs. Prescription drug management.   This patient presents to the ED for concern of bodyaches, nausea, vomiting, upper respiratory symptoms, this involves an extensive number of treatment options, and is a complaint that carries with it a high risk of complications and morbidity.  The differential diagnosis includes COVID, flu, RSV, other viral illnesses, and others   Co morbidities that complicate the patient evaluation  None   Additional history obtained:  Additional history obtained from visitor at bedside   Lab Tests:  I Ordered, and personally interpreted labs.  The pertinent results include: Positive influenza A   Problem List / ED Course / Critical interventions / Medication management   I ordered medication including Tylenol, ibuprofen, Zofran for fever and nausea Reevaluation of the patient after these medicines showed that the patient improved I  have reviewed the patients home medicines and have made adjustments as needed   Social Determinants of Health:  Patient has no health insurance, no primary care provider   Test / Admission - Considered:  Patient with positive influenza A test result.  Symptoms began Thursday morning.  Do not feel the patient would benefit from Tamiflu at this time based on timeframe of symptoms.  Will prescribe short course of Zofran for nausea.  Patient is able to tolerate oral intake at this time. Patient advised on supportive care at home.          Final Clinical Impression(s) / ED Diagnoses Final diagnoses:  Influenza A  Nausea and vomiting, unspecified vomiting type    Rx / DC Orders ED Discharge Orders          Ordered    ondansetron (ZOFRAN) 4 MG tablet  Every 8 hours PRN        09/04/23 0251              Darrick Grinder, PA-C 09/04/23 0252    Palumbo, April, MD 09/04/23 1610

## 2023-09-04 NOTE — Discharge Instructions (Signed)
You tested positive tonight for influenza.  You may use acetaminophen and ibuprofen at home for fever and pain control.  I have prescribed a short course of Zofran for nausea and vomiting.  This illness should subside on its own over the next week.  If you develop any life-threatening symptoms please return to the emergency department.

## 2023-12-09 ENCOUNTER — Emergency Department (HOSPITAL_COMMUNITY)
Admission: EM | Admit: 2023-12-09 | Discharge: 2023-12-09 | Disposition: A | Discharge status: Home / Self Care | Payer: Self-pay | Attending: Emergency Medicine | Admitting: Emergency Medicine

## 2023-12-09 ENCOUNTER — Other Ambulatory Visit: Payer: Self-pay

## 2023-12-09 ENCOUNTER — Emergency Department (HOSPITAL_COMMUNITY): Payer: Self-pay

## 2023-12-09 DIAGNOSIS — R21 Rash and other nonspecific skin eruption: Secondary | ICD-10-CM | POA: Insufficient documentation

## 2023-12-09 DIAGNOSIS — M25572 Pain in left ankle and joints of left foot: Secondary | ICD-10-CM | POA: Insufficient documentation

## 2023-12-09 DIAGNOSIS — R0602 Shortness of breath: Secondary | ICD-10-CM | POA: Insufficient documentation

## 2023-12-09 DIAGNOSIS — M7989 Other specified soft tissue disorders: Secondary | ICD-10-CM

## 2023-12-09 LAB — CBC WITH DIFFERENTIAL/PLATELET
Abs Immature Granulocytes: 0.03 10*3/uL (ref 0.00–0.07)
Basophils Absolute: 0 10*3/uL (ref 0.0–0.1)
Basophils Relative: 1 %
Eosinophils Absolute: 0.3 10*3/uL (ref 0.0–0.5)
Eosinophils Relative: 4 %
HCT: 40.7 % (ref 39.0–52.0)
Hemoglobin: 13.7 g/dL (ref 13.0–17.0)
Immature Granulocytes: 0 %
Lymphocytes Relative: 22 %
Lymphs Abs: 1.8 10*3/uL (ref 0.7–4.0)
MCH: 31.2 pg (ref 26.0–34.0)
MCHC: 33.7 g/dL (ref 30.0–36.0)
MCV: 92.7 fL (ref 80.0–100.0)
Monocytes Absolute: 0.5 10*3/uL (ref 0.1–1.0)
Monocytes Relative: 6 %
Neutro Abs: 5.4 10*3/uL (ref 1.7–7.7)
Neutrophils Relative %: 67 %
Platelets: 274 10*3/uL (ref 150–400)
RBC: 4.39 MIL/uL (ref 4.22–5.81)
RDW: 11.8 % (ref 11.5–15.5)
WBC: 8.1 10*3/uL (ref 4.0–10.5)
nRBC: 0 % (ref 0.0–0.2)

## 2023-12-09 LAB — COMPREHENSIVE METABOLIC PANEL WITH GFR
ALT: 16 U/L (ref 0–44)
AST: 19 U/L (ref 15–41)
Albumin: 3.6 g/dL (ref 3.5–5.0)
Alkaline Phosphatase: 53 U/L (ref 38–126)
Anion gap: 7 (ref 5–15)
BUN: 13 mg/dL (ref 6–20)
CO2: 29 mmol/L (ref 22–32)
Calcium: 9.2 mg/dL (ref 8.9–10.3)
Chloride: 102 mmol/L (ref 98–111)
Creatinine, Ser: 0.95 mg/dL (ref 0.61–1.24)
GFR, Estimated: >60 mL/min (ref >60)
Glucose, Bld: 90 mg/dL (ref 70–99)
Potassium: 4 mmol/L (ref 3.5–5.1)
Sodium: 138 mmol/L (ref 135–145)
Total Bilirubin: 0.7 mg/dL (ref 0.0–1.2)
Total Protein: 7.2 g/dL (ref 6.5–8.1)

## 2023-12-09 LAB — D-DIMER, QUANTITATIVE: D-Dimer, Quant: 1.1 ug{FEU}/mL — ABNORMAL HIGH (ref 0.00–0.50)

## 2023-12-09 MED ORDER — IOHEXOL 350 MG/ML SOLN
75.0000 mL | Freq: Once | INTRAVENOUS | Status: AC | PRN
  Administered 2023-12-09: 75 mL via INTRAVENOUS

## 2023-12-09 MED ORDER — FLUORESCEIN SODIUM 1 MG OP STRP: 1.0000 | ORAL_STRIP | Freq: Once | OPHTHALMIC | Status: DC

## 2023-12-09 MED ORDER — TETRACAINE HCL 0.5 % OP SOLN: 2.0000 [drp] | Freq: Once | OPHTHALMIC | Status: DC
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
## 2023-12-09 NOTE — Progress Notes (Signed)
 Lower extremity venous duplex completed. Please see CV Procedures for preliminary results.  Estanislao Heimlich, RVT 12/09/23 4:13 PM

## 2023-12-09 NOTE — Progress Notes (Signed)
 Orthopedic Tech Progress Note Patient Details:  Kurt Sullivan [birth year] [id number]  Ortho Devices Type of Ortho Device: ASO Ortho Device/Splint Location: left aso applied Ortho Device/Splint Interventions: Ordered, Application, Adjustment   Post Interventions Patient Tolerated: Well Instructions Provided: Adjustment of device, Care of device  [name] 12/09/2023, 11:19 AM
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
## 2023-12-09 NOTE — ED Triage Notes (Addendum)
 Pt c/o L ankle pain for 3x weeks. Pt was tackled while playing fb. Weight bearing, intermittent swelling  Also c/o gen rash over body- states "skin doesn't feel as clear as usual". No raised or red areas visualized.

## 2023-12-09 NOTE — Discharge Instructions (Addendum)
 You were evaluated in the emergency room for ankle pain.  Your x-ray did not show any obvious fracture.  Please wear your ankle brace whenever weightbearing for an ankle sprain.  If your symptoms persist please follow-up with the orthopedic doctor.  Additionally use ice and ibuprofen .  If you experience recurrent allergic reaction and rash, you may trial Benadryl.  Otherwise please follow-up with your primary care doctor if these symptoms persist. Your imaging showed  no evidence of DVT or Pulmonary Embolus.
